# Patient Record
Sex: Female | Born: 1980 | Race: Black or African American | Hispanic: No | Smoking: Never smoker
Health system: Southern US, Community
[De-identification: ages and names within clinical notes are randomized; demographics above are authoritative.]

## PROBLEM LIST (undated history)

## (undated) DIAGNOSIS — I2699 Other pulmonary embolism without acute cor pulmonale: Secondary | ICD-10-CM

## (undated) DIAGNOSIS — D649 Anemia, unspecified: Secondary | ICD-10-CM

## (undated) DIAGNOSIS — I10 Essential (primary) hypertension: Secondary | ICD-10-CM

## (undated) DIAGNOSIS — I82409 Acute embolism and thrombosis of unspecified deep veins of unspecified lower extremity: Secondary | ICD-10-CM

## (undated) DIAGNOSIS — F32A Depression, unspecified: Secondary | ICD-10-CM

## (undated) DIAGNOSIS — F419 Anxiety disorder, unspecified: Secondary | ICD-10-CM

## (undated) HISTORY — PX: HERNIA REPAIR: SHX51

## (undated) HISTORY — PX: TONSILLECTOMY: SUR1361

## (undated) HISTORY — DX: Anxiety disorder, unspecified: F41.9

## (undated) HISTORY — DX: Other pulmonary embolism without acute cor pulmonale: I26.99

## (undated) HISTORY — DX: Anemia, unspecified: D64.9

## (undated) HISTORY — DX: Essential (primary) hypertension: I10

## (undated) HISTORY — DX: Depression, unspecified: F32.A

---

## 2008-12-17 DIAGNOSIS — I82409 Acute embolism and thrombosis of unspecified deep veins of unspecified lower extremity: Secondary | ICD-10-CM

## 2008-12-17 HISTORY — DX: Acute embolism and thrombosis of unspecified deep veins of unspecified lower extremity: I82.409

## 2009-12-18 ENCOUNTER — Ambulatory Visit: Payer: Self-pay | Admitting: Family Medicine

## 2009-12-18 ENCOUNTER — Other Ambulatory Visit: Admission: RE | Admit: 2009-12-18 | Discharge: 2009-12-18 | Payer: Self-pay | Admitting: Family Medicine

## 2009-12-18 DIAGNOSIS — D509 Iron deficiency anemia, unspecified: Secondary | ICD-10-CM | POA: Insufficient documentation

## 2009-12-23 LAB — CONVERTED CEMR LAB
AST: 18 units/L (ref 0–37)
Alkaline Phosphatase: 52 units/L (ref 39–117)
BUN: 11 mg/dL (ref 6–23)
Basophils Absolute: 0 10*3/uL (ref 0.0–0.1)
Bilirubin, Direct: 0.1 mg/dL (ref 0.0–0.3)
Calcium: 9.1 mg/dL (ref 8.4–10.5)
GFR calc non Af Amer: 96.88 mL/min (ref >60)
Glucose, Bld: 80 mg/dL (ref 70–99)
HDL: 56 mg/dL (ref >39.00)
Hemoglobin: 12.4 g/dL (ref 12.0–15.0)
Lymphocytes Relative: 32.3 % (ref 12.0–46.0)
Monocytes Relative: 5.7 % (ref 3.0–12.0)
Neutrophils Relative %: 58.2 % (ref 43.0–77.0)
Platelets: 223 10*3/uL (ref 150.0–400.0)
RDW: 13.7 % (ref 11.5–14.6)
Sodium: 136 meq/L (ref 135–145)
Total Bilirubin: 0.4 mg/dL (ref 0.3–1.2)
Transferrin: 339.4 mg/dL (ref 212.0–360.0)
Triglycerides: 54 mg/dL (ref 0.0–149.0)

## 2010-05-18 NOTE — Letter (Signed)
Summary: Records Dated 04-15-09 thru 06-12-09/[facility]  Records Dated 04-15-09 thru 06-12-09/[facility]   Imported By: Lanelle Bal 12/30/2009 14:26:04  _____________________________________________________________________  External Attachment:    Type:   Image     Comment:   External Document

## 2010-05-18 NOTE — Assessment & Plan Note (Signed)
Summary: NEW TO ESTAB/CBS   Vital Signs:  Patient profile:   30 year old female Menstrual status:  regular LMP:     11/30/2009 Height:      67.75 inches Weight:      189.9 pounds BMI:     29.19 Temp:     98.7 degrees F oral Pulse rate:   80 / minute BP sitting:   120 / 74  (right arm)  Vitals Entered By: Almeta Monas CMA Duncan Dull) (December 18, 2009 10:02 AM) CC: CPX/ fasting, needs Birth control and pap LMP (date): 11/30/2009     Menstrual Status regular Enter LMP: 11/30/2009   History of Present Illness: Pt here for cpe, labs and pap.  No complaints.    Preventive Screening-Counseling & Management  Alcohol-Tobacco     Alcohol drinks/day: 0     Smoking Status: never  Caffeine-Diet-Exercise     Caffeine use/day: 0     Does Patient Exercise: no     Exercise Counseling: to improve exercise regimen  Hep-HIV-STD-Contraception     Dental Visit-last 6 months no     Dental Care Counseling: to seek dental care; no dental care within six months     SBE monthly: no     SBE Education/Counseling: to perform regular SBE      Sexual History:  single and divorced.        Drug Use:  no.    Current Medications (verified): 1)  Cryselle-28 0.3-30 Mg-Mcg Tabs (Norgestrel-Ethinyl Estradiol) .Marland Kitchen.. 1 By Mouth Qd 2)  Ibuprofen 800 Mg Tabs (Ibuprofen) .... By Mouth 3 Times Daily  Allergies (verified): No Known Drug Allergies  Past History:  Family History: Last updated: 12/18/2009 Both parents have HTN Family History Hypertension  Social History: Last updated: 12/18/2009 Occupation:  [employer] Divorced Never Smoked Alcohol use-no Drug use-no Regular exercise-no  Risk Factors: Alcohol Use: 0 (12/18/2009) Caffeine Use: 0 (12/18/2009) Exercise: no (12/18/2009)  Risk Factors: Smoking Status: never (12/18/2009)  Past Medical History: Anemia-iron deficiency superficial clot R leg 08/2009 --[place]  Past Surgical History: C-section 2004 Tonils removed  1997 Tonsillectomy  Family History: Reviewed history and no changes required. Both parents have HTN Family History Hypertension  Social History: Reviewed history and no changes required. Occupation:  [employer] Divorced Never Smoked Alcohol use-no Drug use-no Regular exercise-no Occupation:  employed Smoking Status:  never Drug Use:  no Does Patient Exercise:  no Caffeine use/day:  0 Dental Care w/in 6 mos.:  no Sexual History:  single, divorced  Review of Systems      See HPI General:  Denies chills, fatigue, fever, loss of appetite, malaise, sleep disorder, sweats, weakness, and weight loss. Eyes:  Denies blurring, discharge, double vision, eye irritation, eye pain, halos, itching, light sensitivity, red eye, vision loss-1 eye, and vision loss-both eyes; optho--+ contacts. ENT:  Denies decreased hearing, difficulty swallowing, ear discharge, earache, hoarseness, nasal congestion, nosebleeds, postnasal drainage, ringing in ears, sinus pressure, and sore throat. CV:  Denies bluish discoloration of lips or nails, chest pain or discomfort, difficulty breathing at night, difficulty breathing while lying down, fainting, fatigue, leg cramps with exertion, lightheadness, near fainting, palpitations, shortness of breath with exertion, swelling of feet, swelling of hands, and weight gain. Resp:  Denies chest discomfort, chest pain with inspiration, cough, coughing up blood, excessive snoring, hypersomnolence, morning headaches, pleuritic, shortness of breath, sputum productive, and wheezing. GI:  Denies abdominal pain, bloody stools, change in bowel habits, constipation, dark tarry stools, diarrhea, excessive appetite, gas,  hemorrhoids, indigestion, loss of appetite, and nausea. GU:  Denies abnormal vaginal bleeding, decreased libido, discharge, dysuria, genital sores, hematuria, incontinence, nocturia, urinary frequency, and urinary hesitancy. MS:  Denies joint pain, joint redness, joint  swelling, loss of strength, low back pain, mid back pain, muscle aches, muscle , cramps, muscle weakness, stiffness, and thoracic pain. Derm:  Denies changes in color of skin, changes in nail beds, dryness, excessive perspiration, flushing, hair loss, insect bite(s), itching, lesion(s), poor wound healing, and rash. Neuro:  Denies brief paralysis, difficulty with concentration, disturbances in coordination, falling down, headaches, inability to speak, memory loss, numbness, poor balance, seizures, sensation of room spinning, tingling, tremors, visual disturbances, and weakness. Psych:  Denies alternate hallucination ( auditory/visual), anxiety, depression, easily angered, easily tearful, irritability, mental problems, panic attacks, sense of great danger, suicidal thoughts/plans, thoughts of violence, unusual visions or sounds, and thoughts /plans of harming others. Endo:  Denies cold intolerance, excessive hunger, excessive thirst, excessive urination, heat intolerance, polyuria, and weight change. Heme:  Denies abnormal bruising, bleeding, enlarge lymph nodes, fevers, pallor, and skin discoloration. Allergy:  Denies hives or rash, itching eyes, persistent infections, seasonal allergies, and sneezing.  Physical Exam  General:  Well-developed,well-nourished,in no acute distress; alert,appropriate and cooperative throughout examination Head:  Normocephalic and atraumatic without obvious abnormalities. No apparent alopecia or balding. Eyes:  vision grossly intact, pupils equal, pupils round, pupils reactive to light, and no injection.   Ears:  External ear exam shows no significant lesions or deformities.  Otoscopic examination reveals clear canals, tympanic membranes are intact bilaterally without bulging, retraction, inflammation or discharge. Hearing is grossly normal bilaterally. Nose:  External nasal examination shows no deformity or inflammation. Nasal mucosa are pink and moist without lesions or  exudates. Mouth:  Oral mucosa and oropharynx without lesions or exudates.  Teeth in good repair. Neck:  No deformities, masses, or tenderness noted. Breasts:  No mass, nodules, thickening, tenderness, bulging, retraction, inflamation, nipple discharge or skin changes noted.   Lungs:  Normal respiratory effort, chest expands symmetrically. Lungs are clear to auscultation, no crackles or wheezes. Heart:  Normal rate and regular rhythm. S1 and S2 normal without gallop, murmur, click, rub or other extra sounds. Abdomen:  Bowel sounds positive,abdomen soft and non-tender without masses, organomegaly or hernias noted. Genitalia:  Pelvic Exam:        External: normal female genitalia without lesions or masses        Vagina: normal without lesions or masses        Cervix: normal without lesions or masses        Adnexa: normal bimanual exam without masses or fullness        Uterus: normal by palpation        Pap smear: performed Msk:  normal ROM, no joint tenderness, no joint swelling, no joint warmth, no redness over joints, no joint deformities, no joint instability, and no crepitation.   Pulses:  R posterior tibial normal, R dorsalis pedis normal, R carotid normal, L posterior tibial normal, L dorsalis pedis normal, and L carotid normal.   Extremities:  No clubbing, cyanosis, edema, or deformity noted with normal full range of motion of all joints.   Neurologic:  No cranial nerve deficits noted. Station and gait are normal. Plantar reflexes are down-going bilaterally. DTRs are symmetrical throughout. Sensory, motor and coordinative functions appear intact. Skin:  Intact without suspicious lesions or rashes Cervical Nodes:  No lymphadenopathy noted Axillary Nodes:  No palpable lymphadenopathy Psych:  Cognition and judgment  appear intact. Alert and cooperative with normal attention span and concentration. No apparent delusions, illusions, hallucinations   Impression & Recommendations:  Problem # 1:   PREVENTIVE HEALTH CARE (ICD-V70.0) refill bcp ghm utd gave numbers for ob/gyns to discuss permanent sterilization Orders: Venipuncture (10272) TLB-B12 + Folate Pnl (53664_40347-Q25/ZDG) TLB-IBC Pnl (Iron/FE;Transferrin) (83550-IBC) TLB-Lipid Panel (80061-LIPID) TLB-BMP (Basic Metabolic Panel-BMET) (80048-METABOL) TLB-CBC Platelet - w/Differential (85025-CBCD) TLB-Hepatic/Liver Function Pnl (80076-HEPATIC) TLB-TSH (Thyroid Stimulating Hormone) (84443-TSH) Specimen Handling (38756)  Problem # 2:  ANEMIA-IRON DEFICIENCY (ICD-280.9)  Orders: Venipuncture (43329) TLB-B12 + Folate Pnl ([id number]-T01/SWF) TLB-IBC Pnl (Iron/FE;Transferrin) (83550-IBC) TLB-Lipid Panel (80061-LIPID) TLB-BMP (Basic Metabolic Panel-BMET) (80048-METABOL) TLB-CBC Platelet - w/Differential (85025-CBCD) TLB-Hepatic/Liver Function Pnl (80076-HEPATIC) TLB-TSH (Thyroid Stimulating Hormone) (84443-TSH) Specimen Handling (09323)  Complete Medication List: 1)  Cryselle-28 0.3-30 Mg-mcg Tabs (Norgestrel-ethinyl estradiol) .Marland Kitchen.. 1 by mouth qd 2)  Ibuprofen 800 Mg Tabs (Ibuprofen) .... By mouth 3 times daily  Patient Instructions: 1)  Please schedule a follow-up appointment in 1 year.  Prescriptions: CRYSELLE-28 0.3-30 MG-MCG TABS (NORGESTREL-ETHINYL ESTRADIOL) 1 by mouth qd  #1 x 11   Entered and Authorized by:   Loreen Freud DO   Signed by:   Loreen Freud DO on 12/18/2009   Method used:   Electronically to        [facility] [address] (retail)       [id number]       [place], [place]  [address]       Ph: [phone]       Fax: [phone]   RxID:   [id number]   Flu Vaccine Next Due:  Refused TD Result Date:  12/31/2008 TD Result:  given TD Next Due:  10 yr   Appended Document: NEW TO ESTAB/CBS  Laboratory Results   Urine Tests   Date/Time Reported: December 18, 2009 12:36 PM   Routine Urinalysis   Color: yellow Appearance: Clear Glucose:  negative   (Normal Range: Negative) Bilirubin: negative   (Normal Range: Negative) Ketone: negative   (Normal Range: Negative) Spec. Gravity: 1.015   (Normal Range: 1.003-1.035) Blood: negative   (Normal Range: Negative) pH: 6.0   (Normal Range: 5.0-8.0) Protein: negative   (Normal Range: Negative) Urobilinogen: negative   (Normal Range: 0-1) Nitrite: negative   (Normal Range: Negative) Leukocyte Esterace: negative   (Normal Range: Negative)    Comments: Floydene Flock  December 18, 2009 12:36 PM

## 2011-02-02 ENCOUNTER — Ambulatory Visit (HOSPITAL_BASED_OUTPATIENT_CLINIC_OR_DEPARTMENT_OTHER)
Admission: RE | Admit: 2011-02-02 | Discharge: 2011-02-02 | Disposition: A | Discharge status: Home / Self Care | Payer: 59 | Source: Ambulatory Visit | Attending: Internal Medicine | Admitting: Internal Medicine

## 2011-02-02 ENCOUNTER — Other Ambulatory Visit (HOSPITAL_BASED_OUTPATIENT_CLINIC_OR_DEPARTMENT_OTHER): Payer: Self-pay | Admitting: Internal Medicine

## 2011-02-02 DIAGNOSIS — I82409 Acute embolism and thrombosis of unspecified deep veins of unspecified lower extremity: Secondary | ICD-10-CM

## 2011-02-02 DIAGNOSIS — M79609 Pain in unspecified limb: Secondary | ICD-10-CM | POA: Insufficient documentation

## 2011-04-01 ENCOUNTER — Encounter: Payer: Self-pay | Admitting: Emergency Medicine

## 2011-04-01 ENCOUNTER — Emergency Department (HOSPITAL_BASED_OUTPATIENT_CLINIC_OR_DEPARTMENT_OTHER)
Admission: EM | Admit: 2011-04-01 | Discharge: 2011-04-01 | Disposition: A | Discharge status: Home / Self Care | Payer: 59 | Attending: Emergency Medicine | Admitting: Emergency Medicine

## 2011-04-01 DIAGNOSIS — M79609 Pain in unspecified limb: Secondary | ICD-10-CM | POA: Insufficient documentation

## 2011-04-01 DIAGNOSIS — Z86718 Personal history of other venous thrombosis and embolism: Secondary | ICD-10-CM | POA: Insufficient documentation

## 2011-04-01 DIAGNOSIS — I809 Phlebitis and thrombophlebitis of unspecified site: Secondary | ICD-10-CM | POA: Insufficient documentation

## 2011-04-01 HISTORY — DX: Acute embolism and thrombosis of unspecified deep veins of unspecified lower extremity: I82.409

## 2011-04-01 MED ORDER — HYDROCODONE-ACETAMINOPHEN 5-500 MG PO TABS: 1.0000 | ORAL_TABLET | Freq: Four times a day (QID) | ORAL | Status: AC | PRN

## 2011-04-01 NOTE — ED Notes (Signed)
Pt c/o of right leg pain x 1 day-swelling behind knee-painful/warm  to touch

## 2011-04-01 NOTE — ED Provider Notes (Signed)
I saw and evaluated the patient, reviewed the resident's note and I agree with the findings and plan. Patient with superficial thrombophlebitis of the right leg with mild warmth and erythema. Patient has no swelling in the leg has obvious large varicose veins. No symptoms concerning for DVT. However patient is [redacted] weeks pregnant at this time and we cannot give anti-inflammatories so will have her use warm compresses and compression hose.  Gwyneth Sprout, MD 04/01/11 1220

## 2011-04-01 NOTE — ED Provider Notes (Signed)
History     CSN: [id number] Arrival date & time: 04/01/2011 11:29 AM   First MD Initiated Contact with Patient 04/01/11 1125      Chief Complaint  Patient presents with  . Leg Pain    (Consider location/radiation/quality/duration/timing/severity/associated sxs/prior treatment) HPI Patient is a 30 year old woman who is [redacted] weeks pregnant presenting with pain and erythema of the right lateral knee. She says that in the middle of October, 2 months go, she was diagnosed with superficial thrombophlebitis, and had a doppler study that was negative for DVT. She also had an episode of superficial thrombophlebitis in 2010. Now, she complains of pain on the right knee with standing. She knows that previously she has taken ibuprofen, but cannot take excess ibuprofen because of her pregnancy.  Past Medical History  Diagnosis Date  . DVT (deep venous thrombosis) 12/2008    Past Surgical History  Procedure Date  . Cesarean section 2004  . Tonsillectomy     No family history on file.  History  Substance Use Topics  . Smoking status: Never Smoker   . Smokeless tobacco: Not on file  . Alcohol Use: No    OB History    Grav Para Term Preterm Abortions TAB SAB Ect Mult Living   1               Review of Systems  Allergies  Review of patient's allergies indicates no known allergies.  Home Medications  No current outpatient prescriptions on file.  BP 132/88  Pulse 93  Temp(Src) 97.5 F (36.4 C) (Oral)  Resp 18  Ht 5\' 7"  (1.702 m)  Wt 201 lb (91.173 kg)  BMI 31.48 kg/m2  SpO2 100%  LMP 01/24/2011  Physical Exam GEN: NAD, young woman, well dressed well nourished. RLE: 2 cm circular area of circumscribed erythema and swelling over lateral R knee.  3x4 cm ellipse of erythema with edema on posteriolateral R knee.  Both areas are very TTP and warm.  Posterior knee no TTP.  No distal edema.  Pulses strong distally.  Moving limb w/o difficulty.  Visible varicose veins throughout RLE,  including areas of erythema. LLE: varicose veins, otherwise unremarkable.  ED Course  Procedures (including critical care time)  Labs Reviewed - No data to display No results found.   No diagnosis found.    MDM  30 year old woman with history of superficial thrombophlebitis in 2010 and 2 months ago, now with repeated superficial thrombophlebitis of the right lateral knee. DVT study in October was negative, and no need to repeat at this time given localized findings. Patient is [redacted] weeks pregnant, so cannot give NSAIDs. Will prescribe Tylenol, thigh high leg compression stockings, warm compresses, and leg elevation. She does stand for work, so we will encourage her to sit as much as possible.        Kathreen Cosier, MD 04/01/11 1201

## 2013-01-18 DIAGNOSIS — I10 Essential (primary) hypertension: Secondary | ICD-10-CM | POA: Insufficient documentation

## 2014-02-17 ENCOUNTER — Encounter: Payer: Self-pay | Admitting: Emergency Medicine

## 2016-03-06 LAB — HM HIV SCREENING LAB: HM HIV Screening: NEGATIVE

## 2016-05-09 DIAGNOSIS — I2699 Other pulmonary embolism without acute cor pulmonale: Secondary | ICD-10-CM | POA: Insufficient documentation

## 2016-11-21 DIAGNOSIS — F32A Depression, unspecified: Secondary | ICD-10-CM | POA: Insufficient documentation

## 2018-01-16 DIAGNOSIS — M5412 Radiculopathy, cervical region: Secondary | ICD-10-CM | POA: Insufficient documentation

## 2019-03-06 DIAGNOSIS — F410 Panic disorder [episodic paroxysmal anxiety] without agoraphobia: Secondary | ICD-10-CM | POA: Insufficient documentation

## 2019-03-08 DIAGNOSIS — R87610 Atypical squamous cells of undetermined significance on cytologic smear of cervix (ASC-US): Secondary | ICD-10-CM | POA: Insufficient documentation

## 2019-03-08 DIAGNOSIS — D219 Benign neoplasm of connective and other soft tissue, unspecified: Secondary | ICD-10-CM | POA: Insufficient documentation

## 2019-03-21 DIAGNOSIS — R198 Other specified symptoms and signs involving the digestive system and abdomen: Secondary | ICD-10-CM | POA: Diagnosis not present

## 2019-03-21 DIAGNOSIS — R413 Other amnesia: Secondary | ICD-10-CM | POA: Diagnosis not present

## 2019-03-21 DIAGNOSIS — Z0000 Encounter for general adult medical examination without abnormal findings: Secondary | ICD-10-CM | POA: Diagnosis not present

## 2019-04-03 DIAGNOSIS — K7689 Other specified diseases of liver: Secondary | ICD-10-CM | POA: Diagnosis not present

## 2019-04-03 DIAGNOSIS — F321 Major depressive disorder, single episode, moderate: Secondary | ICD-10-CM | POA: Diagnosis not present

## 2019-04-03 DIAGNOSIS — R198 Other specified symptoms and signs involving the digestive system and abdomen: Secondary | ICD-10-CM | POA: Diagnosis not present

## 2019-04-03 DIAGNOSIS — F410 Panic disorder [episodic paroxysmal anxiety] without agoraphobia: Secondary | ICD-10-CM | POA: Diagnosis not present

## 2019-04-22 DIAGNOSIS — Z1231 Encounter for screening mammogram for malignant neoplasm of breast: Secondary | ICD-10-CM | POA: Diagnosis not present

## 2019-04-22 DIAGNOSIS — Z1239 Encounter for other screening for malignant neoplasm of breast: Secondary | ICD-10-CM | POA: Diagnosis not present

## 2019-04-22 LAB — HM MAMMOGRAPHY

## 2019-04-23 DIAGNOSIS — Z86711 Personal history of pulmonary embolism: Secondary | ICD-10-CM | POA: Diagnosis not present

## 2019-04-23 DIAGNOSIS — R9389 Abnormal findings on diagnostic imaging of other specified body structures: Secondary | ICD-10-CM | POA: Diagnosis not present

## 2019-04-23 DIAGNOSIS — Z8759 Personal history of other complications of pregnancy, childbirth and the puerperium: Secondary | ICD-10-CM | POA: Diagnosis not present

## 2019-04-23 DIAGNOSIS — N852 Hypertrophy of uterus: Secondary | ICD-10-CM | POA: Diagnosis not present

## 2019-04-23 DIAGNOSIS — Z86018 Personal history of other benign neoplasm: Secondary | ICD-10-CM | POA: Diagnosis not present

## 2019-04-23 DIAGNOSIS — Z9889 Other specified postprocedural states: Secondary | ICD-10-CM | POA: Diagnosis not present

## 2019-04-23 DIAGNOSIS — Z8742 Personal history of other diseases of the female genital tract: Secondary | ICD-10-CM | POA: Diagnosis not present

## 2019-04-23 DIAGNOSIS — D251 Intramural leiomyoma of uterus: Secondary | ICD-10-CM | POA: Diagnosis not present

## 2019-04-23 DIAGNOSIS — N925 Other specified irregular menstruation: Secondary | ICD-10-CM | POA: Diagnosis not present

## 2019-05-02 DIAGNOSIS — F410 Panic disorder [episodic paroxysmal anxiety] without agoraphobia: Secondary | ICD-10-CM | POA: Diagnosis not present

## 2019-05-02 DIAGNOSIS — F321 Major depressive disorder, single episode, moderate: Secondary | ICD-10-CM | POA: Diagnosis not present

## 2019-05-30 DIAGNOSIS — M25521 Pain in right elbow: Secondary | ICD-10-CM | POA: Diagnosis not present

## 2019-06-11 DIAGNOSIS — F321 Major depressive disorder, single episode, moderate: Secondary | ICD-10-CM | POA: Diagnosis not present

## 2019-06-11 DIAGNOSIS — F410 Panic disorder [episodic paroxysmal anxiety] without agoraphobia: Secondary | ICD-10-CM | POA: Diagnosis not present

## 2019-06-25 DIAGNOSIS — F321 Major depressive disorder, single episode, moderate: Secondary | ICD-10-CM | POA: Diagnosis not present

## 2019-06-25 DIAGNOSIS — F410 Panic disorder [episodic paroxysmal anxiety] without agoraphobia: Secondary | ICD-10-CM | POA: Diagnosis not present

## 2019-06-27 DIAGNOSIS — E669 Obesity, unspecified: Secondary | ICD-10-CM | POA: Diagnosis not present

## 2019-06-27 DIAGNOSIS — N852 Hypertrophy of uterus: Secondary | ICD-10-CM | POA: Diagnosis not present

## 2019-06-27 DIAGNOSIS — Z9889 Other specified postprocedural states: Secondary | ICD-10-CM | POA: Diagnosis not present

## 2019-06-27 DIAGNOSIS — Z86711 Personal history of pulmonary embolism: Secondary | ICD-10-CM | POA: Diagnosis not present

## 2019-06-27 DIAGNOSIS — R10811 Right upper quadrant abdominal tenderness: Secondary | ICD-10-CM | POA: Diagnosis not present

## 2019-06-27 DIAGNOSIS — K469 Unspecified abdominal hernia without obstruction or gangrene: Secondary | ICD-10-CM | POA: Diagnosis not present

## 2019-06-27 DIAGNOSIS — R9389 Abnormal findings on diagnostic imaging of other specified body structures: Secondary | ICD-10-CM | POA: Diagnosis not present

## 2019-06-27 DIAGNOSIS — D251 Intramural leiomyoma of uterus: Secondary | ICD-10-CM | POA: Diagnosis not present

## 2019-06-27 DIAGNOSIS — Z8759 Personal history of other complications of pregnancy, childbirth and the puerperium: Secondary | ICD-10-CM | POA: Diagnosis not present

## 2019-06-27 DIAGNOSIS — Z01818 Encounter for other preprocedural examination: Secondary | ICD-10-CM | POA: Diagnosis not present

## 2019-06-27 DIAGNOSIS — N925 Other specified irregular menstruation: Secondary | ICD-10-CM | POA: Diagnosis not present

## 2019-07-01 DIAGNOSIS — Z20822 Contact with and (suspected) exposure to covid-19: Secondary | ICD-10-CM | POA: Diagnosis not present

## 2019-07-01 DIAGNOSIS — N926 Irregular menstruation, unspecified: Secondary | ICD-10-CM | POA: Diagnosis not present

## 2019-07-01 DIAGNOSIS — Z01812 Encounter for preprocedural laboratory examination: Secondary | ICD-10-CM | POA: Diagnosis not present

## 2019-07-02 DIAGNOSIS — M25521 Pain in right elbow: Secondary | ICD-10-CM | POA: Diagnosis not present

## 2019-07-05 DIAGNOSIS — N926 Irregular menstruation, unspecified: Secondary | ICD-10-CM | POA: Diagnosis not present

## 2019-07-05 DIAGNOSIS — N840 Polyp of corpus uteri: Secondary | ICD-10-CM | POA: Diagnosis not present

## 2019-07-05 DIAGNOSIS — D250 Submucous leiomyoma of uterus: Secondary | ICD-10-CM | POA: Diagnosis not present

## 2019-07-05 DIAGNOSIS — Z86711 Personal history of pulmonary embolism: Secondary | ICD-10-CM | POA: Diagnosis not present

## 2019-07-05 DIAGNOSIS — Z9889 Other specified postprocedural states: Secondary | ICD-10-CM | POA: Diagnosis not present

## 2019-07-05 DIAGNOSIS — N859 Noninflammatory disorder of uterus, unspecified: Secondary | ICD-10-CM | POA: Diagnosis not present

## 2019-07-05 DIAGNOSIS — Z8759 Personal history of other complications of pregnancy, childbirth and the puerperium: Secondary | ICD-10-CM | POA: Diagnosis not present

## 2019-07-11 DIAGNOSIS — F321 Major depressive disorder, single episode, moderate: Secondary | ICD-10-CM | POA: Diagnosis not present

## 2019-07-11 DIAGNOSIS — F410 Panic disorder [episodic paroxysmal anxiety] without agoraphobia: Secondary | ICD-10-CM | POA: Diagnosis not present

## 2019-07-31 DIAGNOSIS — I10 Essential (primary) hypertension: Secondary | ICD-10-CM | POA: Diagnosis not present

## 2019-07-31 DIAGNOSIS — Z0000 Encounter for general adult medical examination without abnormal findings: Secondary | ICD-10-CM | POA: Diagnosis not present

## 2019-08-08 DIAGNOSIS — E782 Mixed hyperlipidemia: Secondary | ICD-10-CM | POA: Diagnosis not present

## 2019-08-08 DIAGNOSIS — Z09 Encounter for follow-up examination after completed treatment for conditions other than malignant neoplasm: Secondary | ICD-10-CM | POA: Diagnosis not present

## 2019-08-08 DIAGNOSIS — B373 Candidiasis of vulva and vagina: Secondary | ICD-10-CM | POA: Diagnosis not present

## 2019-08-08 DIAGNOSIS — M25561 Pain in right knee: Secondary | ICD-10-CM | POA: Diagnosis not present

## 2019-08-08 DIAGNOSIS — E876 Hypokalemia: Secondary | ICD-10-CM | POA: Diagnosis not present

## 2019-08-08 DIAGNOSIS — I10 Essential (primary) hypertension: Secondary | ICD-10-CM | POA: Diagnosis not present

## 2019-08-08 DIAGNOSIS — N938 Other specified abnormal uterine and vaginal bleeding: Secondary | ICD-10-CM | POA: Diagnosis not present

## 2019-08-08 DIAGNOSIS — R233 Spontaneous ecchymoses: Secondary | ICD-10-CM | POA: Diagnosis not present

## 2019-08-08 DIAGNOSIS — R7301 Impaired fasting glucose: Secondary | ICD-10-CM | POA: Diagnosis not present

## 2019-08-08 DIAGNOSIS — Z8742 Personal history of other diseases of the female genital tract: Secondary | ICD-10-CM | POA: Diagnosis not present

## 2019-08-08 DIAGNOSIS — Z9889 Other specified postprocedural states: Secondary | ICD-10-CM | POA: Diagnosis not present

## 2019-08-09 DIAGNOSIS — I2699 Other pulmonary embolism without acute cor pulmonale: Secondary | ICD-10-CM | POA: Diagnosis not present

## 2019-08-09 DIAGNOSIS — R238 Other skin changes: Secondary | ICD-10-CM | POA: Diagnosis not present

## 2019-08-09 DIAGNOSIS — S8001XA Contusion of right knee, initial encounter: Secondary | ICD-10-CM | POA: Diagnosis not present

## 2019-08-09 DIAGNOSIS — R7301 Impaired fasting glucose: Secondary | ICD-10-CM | POA: Diagnosis not present

## 2019-08-09 DIAGNOSIS — M25561 Pain in right knee: Secondary | ICD-10-CM | POA: Diagnosis not present

## 2019-08-15 DIAGNOSIS — F411 Generalized anxiety disorder: Secondary | ICD-10-CM | POA: Diagnosis not present

## 2019-08-15 DIAGNOSIS — Z79899 Other long term (current) drug therapy: Secondary | ICD-10-CM | POA: Diagnosis not present

## 2019-08-15 DIAGNOSIS — F410 Panic disorder [episodic paroxysmal anxiety] without agoraphobia: Secondary | ICD-10-CM | POA: Diagnosis not present

## 2019-08-15 DIAGNOSIS — F321 Major depressive disorder, single episode, moderate: Secondary | ICD-10-CM | POA: Diagnosis not present

## 2019-08-23 DIAGNOSIS — R7989 Other specified abnormal findings of blood chemistry: Secondary | ICD-10-CM | POA: Diagnosis not present

## 2019-08-23 DIAGNOSIS — Z3202 Encounter for pregnancy test, result negative: Secondary | ICD-10-CM | POA: Diagnosis not present

## 2019-08-23 DIAGNOSIS — R079 Chest pain, unspecified: Secondary | ICD-10-CM | POA: Diagnosis not present

## 2019-08-23 DIAGNOSIS — K439 Ventral hernia without obstruction or gangrene: Secondary | ICD-10-CM | POA: Diagnosis not present

## 2019-08-23 DIAGNOSIS — M791 Myalgia, unspecified site: Secondary | ICD-10-CM | POA: Diagnosis not present

## 2019-08-23 DIAGNOSIS — I82442 Acute embolism and thrombosis of left tibial vein: Secondary | ICD-10-CM | POA: Diagnosis not present

## 2019-08-23 DIAGNOSIS — I82462 Acute embolism and thrombosis of left calf muscular vein: Secondary | ICD-10-CM | POA: Diagnosis not present

## 2019-08-23 DIAGNOSIS — M79662 Pain in left lower leg: Secondary | ICD-10-CM | POA: Diagnosis not present

## 2019-08-23 DIAGNOSIS — R1033 Periumbilical pain: Secondary | ICD-10-CM | POA: Diagnosis not present

## 2019-08-25 DIAGNOSIS — R079 Chest pain, unspecified: Secondary | ICD-10-CM | POA: Diagnosis not present

## 2019-08-28 DIAGNOSIS — D649 Anemia, unspecified: Secondary | ICD-10-CM | POA: Diagnosis not present

## 2019-08-28 DIAGNOSIS — I82442 Acute embolism and thrombosis of left tibial vein: Secondary | ICD-10-CM | POA: Diagnosis not present

## 2019-08-29 DIAGNOSIS — Z8759 Personal history of other complications of pregnancy, childbirth and the puerperium: Secondary | ICD-10-CM | POA: Diagnosis not present

## 2019-08-29 DIAGNOSIS — Z86711 Personal history of pulmonary embolism: Secondary | ICD-10-CM | POA: Diagnosis not present

## 2019-08-29 DIAGNOSIS — Z79899 Other long term (current) drug therapy: Secondary | ICD-10-CM | POA: Diagnosis not present

## 2019-08-29 DIAGNOSIS — Z86718 Personal history of other venous thrombosis and embolism: Secondary | ICD-10-CM | POA: Diagnosis not present

## 2019-08-29 DIAGNOSIS — I824Z2 Acute embolism and thrombosis of unspecified deep veins of left distal lower extremity: Secondary | ICD-10-CM | POA: Diagnosis not present

## 2019-08-29 DIAGNOSIS — D689 Coagulation defect, unspecified: Secondary | ICD-10-CM | POA: Diagnosis not present

## 2019-08-29 DIAGNOSIS — Z7901 Long term (current) use of anticoagulants: Secondary | ICD-10-CM | POA: Diagnosis not present

## 2019-08-29 DIAGNOSIS — T148XXA Other injury of unspecified body region, initial encounter: Secondary | ICD-10-CM | POA: Diagnosis not present

## 2019-08-29 DIAGNOSIS — D509 Iron deficiency anemia, unspecified: Secondary | ICD-10-CM | POA: Diagnosis not present

## 2019-08-29 DIAGNOSIS — D649 Anemia, unspecified: Secondary | ICD-10-CM | POA: Diagnosis not present

## 2019-09-05 DIAGNOSIS — M1711 Unilateral primary osteoarthritis, right knee: Secondary | ICD-10-CM | POA: Diagnosis not present

## 2019-09-10 DIAGNOSIS — F410 Panic disorder [episodic paroxysmal anxiety] without agoraphobia: Secondary | ICD-10-CM | POA: Diagnosis not present

## 2019-09-10 DIAGNOSIS — F321 Major depressive disorder, single episode, moderate: Secondary | ICD-10-CM | POA: Diagnosis not present

## 2019-09-10 DIAGNOSIS — Z86718 Personal history of other venous thrombosis and embolism: Secondary | ICD-10-CM | POA: Diagnosis not present

## 2019-09-17 DIAGNOSIS — F410 Panic disorder [episodic paroxysmal anxiety] without agoraphobia: Secondary | ICD-10-CM | POA: Diagnosis not present

## 2019-09-17 DIAGNOSIS — F321 Major depressive disorder, single episode, moderate: Secondary | ICD-10-CM | POA: Diagnosis not present

## 2019-09-23 DIAGNOSIS — F321 Major depressive disorder, single episode, moderate: Secondary | ICD-10-CM | POA: Diagnosis not present

## 2019-09-23 DIAGNOSIS — F410 Panic disorder [episodic paroxysmal anxiety] without agoraphobia: Secondary | ICD-10-CM | POA: Diagnosis not present

## 2019-09-24 DIAGNOSIS — Z86718 Personal history of other venous thrombosis and embolism: Secondary | ICD-10-CM | POA: Diagnosis not present

## 2019-09-24 DIAGNOSIS — M79651 Pain in right thigh: Secondary | ICD-10-CM | POA: Diagnosis not present

## 2019-09-25 DIAGNOSIS — M79651 Pain in right thigh: Secondary | ICD-10-CM | POA: Diagnosis not present

## 2019-09-25 DIAGNOSIS — Z86718 Personal history of other venous thrombosis and embolism: Secondary | ICD-10-CM | POA: Diagnosis not present

## 2019-10-01 DIAGNOSIS — F410 Panic disorder [episodic paroxysmal anxiety] without agoraphobia: Secondary | ICD-10-CM | POA: Diagnosis not present

## 2019-10-01 DIAGNOSIS — F321 Major depressive disorder, single episode, moderate: Secondary | ICD-10-CM | POA: Diagnosis not present

## 2019-10-07 DIAGNOSIS — F321 Major depressive disorder, single episode, moderate: Secondary | ICD-10-CM | POA: Diagnosis not present

## 2019-10-07 DIAGNOSIS — F410 Panic disorder [episodic paroxysmal anxiety] without agoraphobia: Secondary | ICD-10-CM | POA: Diagnosis not present

## 2019-10-15 DIAGNOSIS — F410 Panic disorder [episodic paroxysmal anxiety] without agoraphobia: Secondary | ICD-10-CM | POA: Diagnosis not present

## 2019-10-15 DIAGNOSIS — F321 Major depressive disorder, single episode, moderate: Secondary | ICD-10-CM | POA: Diagnosis not present

## 2019-10-28 DIAGNOSIS — F321 Major depressive disorder, single episode, moderate: Secondary | ICD-10-CM | POA: Diagnosis not present

## 2019-10-28 DIAGNOSIS — F410 Panic disorder [episodic paroxysmal anxiety] without agoraphobia: Secondary | ICD-10-CM | POA: Diagnosis not present

## 2019-10-30 DIAGNOSIS — R238 Other skin changes: Secondary | ICD-10-CM | POA: Diagnosis not present

## 2019-10-30 DIAGNOSIS — D72819 Decreased white blood cell count, unspecified: Secondary | ICD-10-CM | POA: Diagnosis not present

## 2019-10-30 DIAGNOSIS — R233 Spontaneous ecchymoses: Secondary | ICD-10-CM | POA: Diagnosis not present

## 2019-10-30 DIAGNOSIS — R42 Dizziness and giddiness: Secondary | ICD-10-CM | POA: Diagnosis not present

## 2019-10-30 DIAGNOSIS — I82402 Acute embolism and thrombosis of unspecified deep veins of left lower extremity: Secondary | ICD-10-CM | POA: Diagnosis not present

## 2019-10-30 DIAGNOSIS — D509 Iron deficiency anemia, unspecified: Secondary | ICD-10-CM | POA: Diagnosis not present

## 2019-10-30 DIAGNOSIS — Z7901 Long term (current) use of anticoagulants: Secondary | ICD-10-CM | POA: Diagnosis not present

## 2019-10-30 DIAGNOSIS — Z86718 Personal history of other venous thrombosis and embolism: Secondary | ICD-10-CM | POA: Diagnosis not present

## 2019-10-30 DIAGNOSIS — Z79899 Other long term (current) drug therapy: Secondary | ICD-10-CM | POA: Diagnosis not present

## 2019-10-30 DIAGNOSIS — Z86711 Personal history of pulmonary embolism: Secondary | ICD-10-CM | POA: Diagnosis not present

## 2019-11-11 DIAGNOSIS — F321 Major depressive disorder, single episode, moderate: Secondary | ICD-10-CM | POA: Diagnosis not present

## 2019-11-11 DIAGNOSIS — F410 Panic disorder [episodic paroxysmal anxiety] without agoraphobia: Secondary | ICD-10-CM | POA: Diagnosis not present

## 2019-11-12 DIAGNOSIS — Z86018 Personal history of other benign neoplasm: Secondary | ICD-10-CM | POA: Diagnosis not present

## 2019-11-12 DIAGNOSIS — R7303 Prediabetes: Secondary | ICD-10-CM | POA: Diagnosis not present

## 2019-11-12 DIAGNOSIS — H43393 Other vitreous opacities, bilateral: Secondary | ICD-10-CM | POA: Diagnosis not present

## 2019-11-14 DIAGNOSIS — Z09 Encounter for follow-up examination after completed treatment for conditions other than malignant neoplasm: Secondary | ICD-10-CM | POA: Diagnosis not present

## 2019-11-14 DIAGNOSIS — Z8639 Personal history of other endocrine, nutritional and metabolic disease: Secondary | ICD-10-CM | POA: Diagnosis not present

## 2019-11-18 DIAGNOSIS — Z79899 Other long term (current) drug therapy: Secondary | ICD-10-CM | POA: Diagnosis not present

## 2019-11-18 DIAGNOSIS — F410 Panic disorder [episodic paroxysmal anxiety] without agoraphobia: Secondary | ICD-10-CM | POA: Diagnosis not present

## 2019-11-18 DIAGNOSIS — F321 Major depressive disorder, single episode, moderate: Secondary | ICD-10-CM | POA: Diagnosis not present

## 2019-11-18 DIAGNOSIS — F411 Generalized anxiety disorder: Secondary | ICD-10-CM | POA: Diagnosis not present

## 2019-11-25 DIAGNOSIS — F321 Major depressive disorder, single episode, moderate: Secondary | ICD-10-CM | POA: Diagnosis not present

## 2019-11-25 DIAGNOSIS — F410 Panic disorder [episodic paroxysmal anxiety] without agoraphobia: Secondary | ICD-10-CM | POA: Diagnosis not present

## 2019-11-27 DIAGNOSIS — Z86718 Personal history of other venous thrombosis and embolism: Secondary | ICD-10-CM | POA: Diagnosis not present

## 2019-11-27 DIAGNOSIS — M7989 Other specified soft tissue disorders: Secondary | ICD-10-CM | POA: Diagnosis not present

## 2019-11-27 DIAGNOSIS — M79651 Pain in right thigh: Secondary | ICD-10-CM | POA: Diagnosis not present

## 2019-12-09 DIAGNOSIS — F410 Panic disorder [episodic paroxysmal anxiety] without agoraphobia: Secondary | ICD-10-CM | POA: Diagnosis not present

## 2019-12-09 DIAGNOSIS — F321 Major depressive disorder, single episode, moderate: Secondary | ICD-10-CM | POA: Diagnosis not present

## 2019-12-16 DIAGNOSIS — E119 Type 2 diabetes mellitus without complications: Secondary | ICD-10-CM | POA: Diagnosis not present

## 2019-12-16 DIAGNOSIS — H43393 Other vitreous opacities, bilateral: Secondary | ICD-10-CM | POA: Diagnosis not present

## 2019-12-24 DIAGNOSIS — F321 Major depressive disorder, single episode, moderate: Secondary | ICD-10-CM | POA: Diagnosis not present

## 2019-12-24 DIAGNOSIS — F410 Panic disorder [episodic paroxysmal anxiety] without agoraphobia: Secondary | ICD-10-CM | POA: Diagnosis not present

## 2019-12-27 ENCOUNTER — Ambulatory Visit: Payer: Medicaid Other | Attending: Internal Medicine

## 2019-12-27 DIAGNOSIS — Z23 Encounter for immunization: Secondary | ICD-10-CM

## 2019-12-27 NOTE — Progress Notes (Signed)
° °  Covid-19 Vaccination Clinic  Name:  Jody Sandoval    MRN: [id number] DOB: [birth year]  12/27/2019  [name] was observed post Covid-19 immunization for 15 minutes without incident. She was provided with Vaccine Information Sheet and instruction to access the V-Safe system.   [name] was instructed to call 911 with any severe reactions post vaccine:  Difficulty breathing   Swelling of face and throat   A fast heartbeat   A bad rash all over body   Dizziness and weakness   Immunizations Administered    Name Date Dose VIS Date Route   Pfizer COVID-19 Vaccine 12/27/2019 12:14 PM 0.3 mL 06/12/2018 Intramuscular   Manufacturer: [employer], [employer]   Lot: [id number]   NDC: [address]-4

## 2020-01-09 DIAGNOSIS — F410 Panic disorder [episodic paroxysmal anxiety] without agoraphobia: Secondary | ICD-10-CM | POA: Diagnosis not present

## 2020-01-09 DIAGNOSIS — F321 Major depressive disorder, single episode, moderate: Secondary | ICD-10-CM | POA: Diagnosis not present

## 2020-01-20 DIAGNOSIS — F321 Major depressive disorder, single episode, moderate: Secondary | ICD-10-CM | POA: Diagnosis not present

## 2020-01-20 DIAGNOSIS — F410 Panic disorder [episodic paroxysmal anxiety] without agoraphobia: Secondary | ICD-10-CM | POA: Diagnosis not present

## 2020-02-05 DIAGNOSIS — F410 Panic disorder [episodic paroxysmal anxiety] without agoraphobia: Secondary | ICD-10-CM | POA: Diagnosis not present

## 2020-02-05 DIAGNOSIS — F321 Major depressive disorder, single episode, moderate: Secondary | ICD-10-CM | POA: Diagnosis not present

## 2020-02-17 DIAGNOSIS — F410 Panic disorder [episodic paroxysmal anxiety] without agoraphobia: Secondary | ICD-10-CM | POA: Diagnosis not present

## 2020-02-17 DIAGNOSIS — F321 Major depressive disorder, single episode, moderate: Secondary | ICD-10-CM | POA: Diagnosis not present

## 2020-02-17 DIAGNOSIS — F411 Generalized anxiety disorder: Secondary | ICD-10-CM | POA: Diagnosis not present

## 2020-02-19 DIAGNOSIS — F410 Panic disorder [episodic paroxysmal anxiety] without agoraphobia: Secondary | ICD-10-CM | POA: Diagnosis not present

## 2020-02-19 DIAGNOSIS — F320 Major depressive disorder, single episode, mild: Secondary | ICD-10-CM | POA: Diagnosis not present

## 2020-03-03 DIAGNOSIS — Z86711 Personal history of pulmonary embolism: Secondary | ICD-10-CM | POA: Diagnosis not present

## 2020-03-03 DIAGNOSIS — E782 Mixed hyperlipidemia: Secondary | ICD-10-CM | POA: Diagnosis not present

## 2020-03-03 DIAGNOSIS — I10 Essential (primary) hypertension: Secondary | ICD-10-CM | POA: Diagnosis not present

## 2020-03-03 DIAGNOSIS — Z7901 Long term (current) use of anticoagulants: Secondary | ICD-10-CM | POA: Diagnosis not present

## 2020-03-03 DIAGNOSIS — R002 Palpitations: Secondary | ICD-10-CM | POA: Diagnosis not present

## 2020-03-03 DIAGNOSIS — Z8759 Personal history of other complications of pregnancy, childbirth and the puerperium: Secondary | ICD-10-CM | POA: Diagnosis not present

## 2020-03-03 DIAGNOSIS — R7303 Prediabetes: Secondary | ICD-10-CM | POA: Diagnosis not present

## 2020-03-04 DIAGNOSIS — F321 Major depressive disorder, single episode, moderate: Secondary | ICD-10-CM | POA: Diagnosis not present

## 2020-03-04 DIAGNOSIS — F410 Panic disorder [episodic paroxysmal anxiety] without agoraphobia: Secondary | ICD-10-CM | POA: Diagnosis not present

## 2020-03-18 DIAGNOSIS — F320 Major depressive disorder, single episode, mild: Secondary | ICD-10-CM | POA: Diagnosis not present

## 2020-03-18 DIAGNOSIS — F410 Panic disorder [episodic paroxysmal anxiety] without agoraphobia: Secondary | ICD-10-CM | POA: Diagnosis not present

## 2020-03-25 DIAGNOSIS — Z1151 Encounter for screening for human papillomavirus (HPV): Secondary | ICD-10-CM | POA: Diagnosis not present

## 2020-03-25 DIAGNOSIS — Z124 Encounter for screening for malignant neoplasm of cervix: Secondary | ICD-10-CM | POA: Diagnosis not present

## 2020-03-25 DIAGNOSIS — Z8742 Personal history of other diseases of the female genital tract: Secondary | ICD-10-CM | POA: Diagnosis not present

## 2020-03-25 DIAGNOSIS — Z0001 Encounter for general adult medical examination with abnormal findings: Secondary | ICD-10-CM | POA: Diagnosis not present

## 2020-03-25 DIAGNOSIS — N920 Excessive and frequent menstruation with regular cycle: Secondary | ICD-10-CM | POA: Diagnosis not present

## 2020-03-25 LAB — HM PAP SMEAR: HM Pap smear: NEGATIVE

## 2020-04-01 DIAGNOSIS — F410 Panic disorder [episodic paroxysmal anxiety] without agoraphobia: Secondary | ICD-10-CM | POA: Diagnosis not present

## 2020-04-01 DIAGNOSIS — F320 Major depressive disorder, single episode, mild: Secondary | ICD-10-CM | POA: Diagnosis not present

## 2020-04-06 ENCOUNTER — Ambulatory Visit: Payer: 59 | Admitting: Internal Medicine

## 2020-04-16 DIAGNOSIS — R300 Dysuria: Secondary | ICD-10-CM | POA: Diagnosis not present

## 2020-04-16 DIAGNOSIS — R399 Unspecified symptoms and signs involving the genitourinary system: Secondary | ICD-10-CM | POA: Diagnosis not present

## 2020-04-16 DIAGNOSIS — R8290 Unspecified abnormal findings in urine: Secondary | ICD-10-CM | POA: Diagnosis not present

## 2020-04-21 DIAGNOSIS — F320 Major depressive disorder, single episode, mild: Secondary | ICD-10-CM | POA: Diagnosis not present

## 2020-04-21 DIAGNOSIS — F410 Panic disorder [episodic paroxysmal anxiety] without agoraphobia: Secondary | ICD-10-CM | POA: Diagnosis not present

## 2020-04-24 DIAGNOSIS — Z03818 Encounter for observation for suspected exposure to other biological agents ruled out: Secondary | ICD-10-CM | POA: Diagnosis not present

## 2020-04-29 DIAGNOSIS — F321 Major depressive disorder, single episode, moderate: Secondary | ICD-10-CM | POA: Diagnosis not present

## 2020-04-29 DIAGNOSIS — F410 Panic disorder [episodic paroxysmal anxiety] without agoraphobia: Secondary | ICD-10-CM | POA: Diagnosis not present

## 2020-05-13 DIAGNOSIS — F410 Panic disorder [episodic paroxysmal anxiety] without agoraphobia: Secondary | ICD-10-CM | POA: Diagnosis not present

## 2020-05-13 DIAGNOSIS — F321 Major depressive disorder, single episode, moderate: Secondary | ICD-10-CM | POA: Diagnosis not present

## 2020-05-15 DIAGNOSIS — D259 Leiomyoma of uterus, unspecified: Secondary | ICD-10-CM | POA: Diagnosis not present

## 2020-05-18 DIAGNOSIS — F321 Major depressive disorder, single episode, moderate: Secondary | ICD-10-CM | POA: Diagnosis not present

## 2020-05-18 DIAGNOSIS — F411 Generalized anxiety disorder: Secondary | ICD-10-CM | POA: Diagnosis not present

## 2020-05-21 DIAGNOSIS — B9620 Unspecified Escherichia coli [E. coli] as the cause of diseases classified elsewhere: Secondary | ICD-10-CM | POA: Diagnosis not present

## 2020-05-21 DIAGNOSIS — D500 Iron deficiency anemia secondary to blood loss (chronic): Secondary | ICD-10-CM | POA: Diagnosis not present

## 2020-05-21 DIAGNOSIS — D509 Iron deficiency anemia, unspecified: Secondary | ICD-10-CM | POA: Diagnosis not present

## 2020-05-21 DIAGNOSIS — N390 Urinary tract infection, site not specified: Secondary | ICD-10-CM | POA: Diagnosis not present

## 2020-05-21 DIAGNOSIS — Z7901 Long term (current) use of anticoagulants: Secondary | ICD-10-CM | POA: Diagnosis not present

## 2020-05-21 DIAGNOSIS — R350 Frequency of micturition: Secondary | ICD-10-CM | POA: Diagnosis not present

## 2020-05-21 DIAGNOSIS — Z86718 Personal history of other venous thrombosis and embolism: Secondary | ICD-10-CM | POA: Diagnosis not present

## 2020-05-21 DIAGNOSIS — N920 Excessive and frequent menstruation with regular cycle: Secondary | ICD-10-CM | POA: Diagnosis not present

## 2020-05-27 DIAGNOSIS — F321 Major depressive disorder, single episode, moderate: Secondary | ICD-10-CM | POA: Diagnosis not present

## 2020-05-27 DIAGNOSIS — F410 Panic disorder [episodic paroxysmal anxiety] without agoraphobia: Secondary | ICD-10-CM | POA: Diagnosis not present

## 2020-06-01 DIAGNOSIS — D219 Benign neoplasm of connective and other soft tissue, unspecified: Secondary | ICD-10-CM | POA: Diagnosis not present

## 2020-06-01 DIAGNOSIS — N920 Excessive and frequent menstruation with regular cycle: Secondary | ICD-10-CM | POA: Diagnosis not present

## 2020-06-01 DIAGNOSIS — Z9889 Other specified postprocedural states: Secondary | ICD-10-CM | POA: Diagnosis not present

## 2020-06-01 DIAGNOSIS — Z7901 Long term (current) use of anticoagulants: Secondary | ICD-10-CM | POA: Diagnosis not present

## 2020-06-01 DIAGNOSIS — N852 Hypertrophy of uterus: Secondary | ICD-10-CM | POA: Diagnosis not present

## 2020-06-01 DIAGNOSIS — D500 Iron deficiency anemia secondary to blood loss (chronic): Secondary | ICD-10-CM | POA: Diagnosis not present

## 2020-06-01 DIAGNOSIS — Z86718 Personal history of other venous thrombosis and embolism: Secondary | ICD-10-CM | POA: Diagnosis not present

## 2020-06-15 DIAGNOSIS — F321 Major depressive disorder, single episode, moderate: Secondary | ICD-10-CM | POA: Diagnosis not present

## 2020-06-15 DIAGNOSIS — D219 Benign neoplasm of connective and other soft tissue, unspecified: Secondary | ICD-10-CM | POA: Diagnosis not present

## 2020-06-15 DIAGNOSIS — F410 Panic disorder [episodic paroxysmal anxiety] without agoraphobia: Secondary | ICD-10-CM | POA: Diagnosis not present

## 2020-06-15 DIAGNOSIS — F411 Generalized anxiety disorder: Secondary | ICD-10-CM | POA: Diagnosis not present

## 2020-06-16 DIAGNOSIS — Z86718 Personal history of other venous thrombosis and embolism: Secondary | ICD-10-CM | POA: Diagnosis not present

## 2020-06-16 DIAGNOSIS — Z86711 Personal history of pulmonary embolism: Secondary | ICD-10-CM | POA: Diagnosis not present

## 2020-06-16 DIAGNOSIS — K432 Incisional hernia without obstruction or gangrene: Secondary | ICD-10-CM | POA: Diagnosis not present

## 2020-06-16 DIAGNOSIS — I10 Essential (primary) hypertension: Secondary | ICD-10-CM | POA: Diagnosis not present

## 2020-06-16 DIAGNOSIS — Z7901 Long term (current) use of anticoagulants: Secondary | ICD-10-CM | POA: Diagnosis not present

## 2020-06-17 DIAGNOSIS — B9620 Unspecified Escherichia coli [E. coli] as the cause of diseases classified elsewhere: Secondary | ICD-10-CM | POA: Diagnosis not present

## 2020-06-17 DIAGNOSIS — R350 Frequency of micturition: Secondary | ICD-10-CM | POA: Diagnosis not present

## 2020-06-17 DIAGNOSIS — F321 Major depressive disorder, single episode, moderate: Secondary | ICD-10-CM | POA: Diagnosis not present

## 2020-06-17 DIAGNOSIS — F410 Panic disorder [episodic paroxysmal anxiety] without agoraphobia: Secondary | ICD-10-CM | POA: Diagnosis not present

## 2020-06-17 DIAGNOSIS — N390 Urinary tract infection, site not specified: Secondary | ICD-10-CM | POA: Diagnosis not present

## 2020-06-17 DIAGNOSIS — B373 Candidiasis of vulva and vagina: Secondary | ICD-10-CM | POA: Diagnosis not present

## 2020-06-23 ENCOUNTER — Ambulatory Visit: Payer: 59 | Admitting: Internal Medicine

## 2020-06-24 DIAGNOSIS — K432 Incisional hernia without obstruction or gangrene: Secondary | ICD-10-CM | POA: Diagnosis not present

## 2020-06-26 DIAGNOSIS — D2361 Other benign neoplasm of skin of right upper limb, including shoulder: Secondary | ICD-10-CM | POA: Diagnosis not present

## 2020-07-01 DIAGNOSIS — F321 Major depressive disorder, single episode, moderate: Secondary | ICD-10-CM | POA: Diagnosis not present

## 2020-07-01 DIAGNOSIS — F410 Panic disorder [episodic paroxysmal anxiety] without agoraphobia: Secondary | ICD-10-CM | POA: Diagnosis not present

## 2020-07-07 DIAGNOSIS — Z87440 Personal history of urinary (tract) infections: Secondary | ICD-10-CM | POA: Diagnosis not present

## 2020-07-07 DIAGNOSIS — E782 Mixed hyperlipidemia: Secondary | ICD-10-CM | POA: Diagnosis not present

## 2020-07-07 DIAGNOSIS — R3914 Feeling of incomplete bladder emptying: Secondary | ICD-10-CM | POA: Diagnosis not present

## 2020-07-07 DIAGNOSIS — R10819 Abdominal tenderness, unspecified site: Secondary | ICD-10-CM | POA: Diagnosis not present

## 2020-07-07 DIAGNOSIS — D500 Iron deficiency anemia secondary to blood loss (chronic): Secondary | ICD-10-CM | POA: Diagnosis not present

## 2020-07-07 DIAGNOSIS — D259 Leiomyoma of uterus, unspecified: Secondary | ICD-10-CM | POA: Diagnosis not present

## 2020-07-07 DIAGNOSIS — N390 Urinary tract infection, site not specified: Secondary | ICD-10-CM | POA: Diagnosis not present

## 2020-07-07 DIAGNOSIS — N852 Hypertrophy of uterus: Secondary | ICD-10-CM | POA: Diagnosis not present

## 2020-07-07 DIAGNOSIS — N920 Excessive and frequent menstruation with regular cycle: Secondary | ICD-10-CM | POA: Diagnosis not present

## 2020-07-08 DIAGNOSIS — K432 Incisional hernia without obstruction or gangrene: Secondary | ICD-10-CM | POA: Diagnosis not present

## 2020-07-09 DIAGNOSIS — I10 Essential (primary) hypertension: Secondary | ICD-10-CM | POA: Diagnosis not present

## 2020-07-09 DIAGNOSIS — N390 Urinary tract infection, site not specified: Secondary | ICD-10-CM | POA: Diagnosis not present

## 2020-07-09 DIAGNOSIS — R3914 Feeling of incomplete bladder emptying: Secondary | ICD-10-CM | POA: Diagnosis not present

## 2020-07-09 DIAGNOSIS — E782 Mixed hyperlipidemia: Secondary | ICD-10-CM | POA: Diagnosis not present

## 2020-07-09 DIAGNOSIS — R002 Palpitations: Secondary | ICD-10-CM | POA: Diagnosis not present

## 2020-07-09 DIAGNOSIS — Z86711 Personal history of pulmonary embolism: Secondary | ICD-10-CM | POA: Diagnosis not present

## 2020-07-09 DIAGNOSIS — E876 Hypokalemia: Secondary | ICD-10-CM | POA: Diagnosis not present

## 2020-07-09 DIAGNOSIS — Z86718 Personal history of other venous thrombosis and embolism: Secondary | ICD-10-CM | POA: Diagnosis not present

## 2020-07-09 DIAGNOSIS — D259 Leiomyoma of uterus, unspecified: Secondary | ICD-10-CM | POA: Diagnosis not present

## 2020-07-13 DIAGNOSIS — D259 Leiomyoma of uterus, unspecified: Secondary | ICD-10-CM | POA: Diagnosis not present

## 2020-07-16 DIAGNOSIS — K432 Incisional hernia without obstruction or gangrene: Secondary | ICD-10-CM | POA: Diagnosis not present

## 2020-07-16 DIAGNOSIS — D219 Benign neoplasm of connective and other soft tissue, unspecified: Secondary | ICD-10-CM | POA: Diagnosis not present

## 2020-07-16 DIAGNOSIS — Z9889 Other specified postprocedural states: Secondary | ICD-10-CM | POA: Diagnosis not present

## 2020-07-16 DIAGNOSIS — Z86718 Personal history of other venous thrombosis and embolism: Secondary | ICD-10-CM | POA: Diagnosis not present

## 2020-07-16 DIAGNOSIS — D500 Iron deficiency anemia secondary to blood loss (chronic): Secondary | ICD-10-CM | POA: Diagnosis not present

## 2020-07-16 DIAGNOSIS — N920 Excessive and frequent menstruation with regular cycle: Secondary | ICD-10-CM | POA: Diagnosis not present

## 2020-07-17 DIAGNOSIS — F321 Major depressive disorder, single episode, moderate: Secondary | ICD-10-CM | POA: Diagnosis not present

## 2020-07-17 DIAGNOSIS — F410 Panic disorder [episodic paroxysmal anxiety] without agoraphobia: Secondary | ICD-10-CM | POA: Diagnosis not present

## 2020-07-30 DIAGNOSIS — D219 Benign neoplasm of connective and other soft tissue, unspecified: Secondary | ICD-10-CM | POA: Diagnosis not present

## 2020-07-30 DIAGNOSIS — N920 Excessive and frequent menstruation with regular cycle: Secondary | ICD-10-CM | POA: Diagnosis not present

## 2020-07-30 DIAGNOSIS — D500 Iron deficiency anemia secondary to blood loss (chronic): Secondary | ICD-10-CM | POA: Diagnosis not present

## 2020-08-12 DIAGNOSIS — F410 Panic disorder [episodic paroxysmal anxiety] without agoraphobia: Secondary | ICD-10-CM | POA: Diagnosis not present

## 2020-08-12 DIAGNOSIS — F321 Major depressive disorder, single episode, moderate: Secondary | ICD-10-CM | POA: Diagnosis not present

## 2020-08-13 DIAGNOSIS — D219 Benign neoplasm of connective and other soft tissue, unspecified: Secondary | ICD-10-CM | POA: Diagnosis not present

## 2020-08-25 DIAGNOSIS — Z01818 Encounter for other preprocedural examination: Secondary | ICD-10-CM | POA: Diagnosis not present

## 2020-08-25 DIAGNOSIS — Z7901 Long term (current) use of anticoagulants: Secondary | ICD-10-CM | POA: Diagnosis not present

## 2020-08-25 DIAGNOSIS — R002 Palpitations: Secondary | ICD-10-CM | POA: Diagnosis not present

## 2020-08-25 DIAGNOSIS — F410 Panic disorder [episodic paroxysmal anxiety] without agoraphobia: Secondary | ICD-10-CM | POA: Diagnosis not present

## 2020-08-25 DIAGNOSIS — I10 Essential (primary) hypertension: Secondary | ICD-10-CM | POA: Diagnosis not present

## 2020-08-25 DIAGNOSIS — K432 Incisional hernia without obstruction or gangrene: Secondary | ICD-10-CM | POA: Diagnosis not present

## 2020-08-25 DIAGNOSIS — F321 Major depressive disorder, single episode, moderate: Secondary | ICD-10-CM | POA: Diagnosis not present

## 2020-08-27 DIAGNOSIS — N920 Excessive and frequent menstruation with regular cycle: Secondary | ICD-10-CM | POA: Diagnosis not present

## 2020-08-27 DIAGNOSIS — D219 Benign neoplasm of connective and other soft tissue, unspecified: Secondary | ICD-10-CM | POA: Diagnosis not present

## 2020-08-27 DIAGNOSIS — Z86718 Personal history of other venous thrombosis and embolism: Secondary | ICD-10-CM | POA: Diagnosis not present

## 2020-08-27 DIAGNOSIS — Z7901 Long term (current) use of anticoagulants: Secondary | ICD-10-CM | POA: Diagnosis not present

## 2020-08-27 DIAGNOSIS — D215 Benign neoplasm of connective and other soft tissue of pelvis: Secondary | ICD-10-CM | POA: Diagnosis not present

## 2020-08-27 DIAGNOSIS — D500 Iron deficiency anemia secondary to blood loss (chronic): Secondary | ICD-10-CM | POA: Diagnosis not present

## 2020-09-02 DIAGNOSIS — D251 Intramural leiomyoma of uterus: Secondary | ICD-10-CM | POA: Diagnosis not present

## 2020-09-03 DIAGNOSIS — K432 Incisional hernia without obstruction or gangrene: Secondary | ICD-10-CM | POA: Diagnosis not present

## 2020-09-03 DIAGNOSIS — D251 Intramural leiomyoma of uterus: Secondary | ICD-10-CM | POA: Diagnosis not present

## 2020-09-03 DIAGNOSIS — N838 Other noninflammatory disorders of ovary, fallopian tube and broad ligament: Secondary | ICD-10-CM | POA: Diagnosis not present

## 2020-09-03 DIAGNOSIS — N888 Other specified noninflammatory disorders of cervix uteri: Secondary | ICD-10-CM | POA: Diagnosis not present

## 2020-09-03 DIAGNOSIS — N800 Endometriosis of uterus: Secondary | ICD-10-CM | POA: Diagnosis not present

## 2020-09-03 HISTORY — PX: ABDOMINAL HYSTERECTOMY: SHX81

## 2020-09-09 DIAGNOSIS — F410 Panic disorder [episodic paroxysmal anxiety] without agoraphobia: Secondary | ICD-10-CM | POA: Diagnosis not present

## 2020-09-09 DIAGNOSIS — F321 Major depressive disorder, single episode, moderate: Secondary | ICD-10-CM | POA: Diagnosis not present

## 2020-09-10 DIAGNOSIS — Z48815 Encounter for surgical aftercare following surgery on the digestive system: Secondary | ICD-10-CM | POA: Diagnosis not present

## 2020-09-10 DIAGNOSIS — L539 Erythematous condition, unspecified: Secondary | ICD-10-CM | POA: Diagnosis not present

## 2020-09-10 DIAGNOSIS — Z90710 Acquired absence of both cervix and uterus: Secondary | ICD-10-CM | POA: Diagnosis not present

## 2020-09-11 DIAGNOSIS — Z79899 Other long term (current) drug therapy: Secondary | ICD-10-CM | POA: Diagnosis not present

## 2020-09-11 DIAGNOSIS — F411 Generalized anxiety disorder: Secondary | ICD-10-CM | POA: Diagnosis not present

## 2020-09-11 DIAGNOSIS — F410 Panic disorder [episodic paroxysmal anxiety] without agoraphobia: Secondary | ICD-10-CM | POA: Diagnosis not present

## 2020-09-11 DIAGNOSIS — F321 Major depressive disorder, single episode, moderate: Secondary | ICD-10-CM | POA: Diagnosis not present

## 2020-09-23 DIAGNOSIS — F321 Major depressive disorder, single episode, moderate: Secondary | ICD-10-CM | POA: Diagnosis not present

## 2020-09-23 DIAGNOSIS — F410 Panic disorder [episodic paroxysmal anxiety] without agoraphobia: Secondary | ICD-10-CM | POA: Diagnosis not present

## 2020-10-01 DIAGNOSIS — R319 Hematuria, unspecified: Secondary | ICD-10-CM | POA: Diagnosis not present

## 2020-10-01 DIAGNOSIS — D7289 Other specified disorders of white blood cells: Secondary | ICD-10-CM | POA: Diagnosis not present

## 2020-10-01 DIAGNOSIS — N390 Urinary tract infection, site not specified: Secondary | ICD-10-CM | POA: Diagnosis not present

## 2020-10-01 DIAGNOSIS — R808 Other proteinuria: Secondary | ICD-10-CM | POA: Diagnosis not present

## 2020-10-01 DIAGNOSIS — R82998 Other abnormal findings in urine: Secondary | ICD-10-CM | POA: Diagnosis not present

## 2020-10-01 DIAGNOSIS — Z87440 Personal history of urinary (tract) infections: Secondary | ICD-10-CM | POA: Diagnosis not present

## 2020-10-13 DIAGNOSIS — F410 Panic disorder [episodic paroxysmal anxiety] without agoraphobia: Secondary | ICD-10-CM | POA: Diagnosis not present

## 2020-10-13 DIAGNOSIS — F321 Major depressive disorder, single episode, moderate: Secondary | ICD-10-CM | POA: Diagnosis not present

## 2020-10-27 DIAGNOSIS — F410 Panic disorder [episodic paroxysmal anxiety] without agoraphobia: Secondary | ICD-10-CM | POA: Diagnosis not present

## 2020-10-27 DIAGNOSIS — F321 Major depressive disorder, single episode, moderate: Secondary | ICD-10-CM | POA: Diagnosis not present

## 2020-11-13 DIAGNOSIS — F410 Panic disorder [episodic paroxysmal anxiety] without agoraphobia: Secondary | ICD-10-CM | POA: Diagnosis not present

## 2020-11-13 DIAGNOSIS — F321 Major depressive disorder, single episode, moderate: Secondary | ICD-10-CM | POA: Diagnosis not present

## 2020-11-27 DIAGNOSIS — F321 Major depressive disorder, single episode, moderate: Secondary | ICD-10-CM | POA: Diagnosis not present

## 2020-11-27 DIAGNOSIS — F410 Panic disorder [episodic paroxysmal anxiety] without agoraphobia: Secondary | ICD-10-CM | POA: Diagnosis not present

## 2020-12-08 DIAGNOSIS — F411 Generalized anxiety disorder: Secondary | ICD-10-CM | POA: Diagnosis not present

## 2020-12-08 DIAGNOSIS — F410 Panic disorder [episodic paroxysmal anxiety] without agoraphobia: Secondary | ICD-10-CM | POA: Diagnosis not present

## 2020-12-08 DIAGNOSIS — F321 Major depressive disorder, single episode, moderate: Secondary | ICD-10-CM | POA: Diagnosis not present

## 2020-12-11 DIAGNOSIS — F410 Panic disorder [episodic paroxysmal anxiety] without agoraphobia: Secondary | ICD-10-CM | POA: Diagnosis not present

## 2020-12-11 DIAGNOSIS — F321 Major depressive disorder, single episode, moderate: Secondary | ICD-10-CM | POA: Diagnosis not present

## 2020-12-17 DIAGNOSIS — D500 Iron deficiency anemia secondary to blood loss (chronic): Secondary | ICD-10-CM | POA: Diagnosis not present

## 2020-12-23 DIAGNOSIS — H43393 Other vitreous opacities, bilateral: Secondary | ICD-10-CM | POA: Diagnosis not present

## 2020-12-23 DIAGNOSIS — E119 Type 2 diabetes mellitus without complications: Secondary | ICD-10-CM | POA: Diagnosis not present

## 2020-12-23 DIAGNOSIS — F410 Panic disorder [episodic paroxysmal anxiety] without agoraphobia: Secondary | ICD-10-CM | POA: Diagnosis not present

## 2020-12-23 DIAGNOSIS — F321 Major depressive disorder, single episode, moderate: Secondary | ICD-10-CM | POA: Diagnosis not present

## 2020-12-23 DIAGNOSIS — H5213 Myopia, bilateral: Secondary | ICD-10-CM | POA: Diagnosis not present

## 2020-12-23 DIAGNOSIS — H52203 Unspecified astigmatism, bilateral: Secondary | ICD-10-CM | POA: Diagnosis not present

## 2021-01-06 DIAGNOSIS — H5213 Myopia, bilateral: Secondary | ICD-10-CM | POA: Diagnosis not present

## 2021-01-06 DIAGNOSIS — F410 Panic disorder [episodic paroxysmal anxiety] without agoraphobia: Secondary | ICD-10-CM | POA: Diagnosis not present

## 2021-01-06 DIAGNOSIS — F321 Major depressive disorder, single episode, moderate: Secondary | ICD-10-CM | POA: Diagnosis not present

## 2021-01-20 DIAGNOSIS — F410 Panic disorder [episodic paroxysmal anxiety] without agoraphobia: Secondary | ICD-10-CM | POA: Diagnosis not present

## 2021-01-20 DIAGNOSIS — F321 Major depressive disorder, single episode, moderate: Secondary | ICD-10-CM | POA: Diagnosis not present

## 2021-01-21 DIAGNOSIS — I10 Essential (primary) hypertension: Secondary | ICD-10-CM | POA: Diagnosis not present

## 2021-01-21 DIAGNOSIS — K649 Unspecified hemorrhoids: Secondary | ICD-10-CM | POA: Diagnosis not present

## 2021-01-21 DIAGNOSIS — E782 Mixed hyperlipidemia: Secondary | ICD-10-CM | POA: Diagnosis not present

## 2021-01-21 DIAGNOSIS — R002 Palpitations: Secondary | ICD-10-CM | POA: Diagnosis not present

## 2021-01-21 DIAGNOSIS — K625 Hemorrhage of anus and rectum: Secondary | ICD-10-CM | POA: Diagnosis not present

## 2021-01-21 DIAGNOSIS — Z86718 Personal history of other venous thrombosis and embolism: Secondary | ICD-10-CM | POA: Diagnosis not present

## 2021-01-21 DIAGNOSIS — Z23 Encounter for immunization: Secondary | ICD-10-CM | POA: Diagnosis not present

## 2021-01-21 DIAGNOSIS — Z1211 Encounter for screening for malignant neoplasm of colon: Secondary | ICD-10-CM | POA: Diagnosis not present

## 2021-02-02 DIAGNOSIS — R109 Unspecified abdominal pain: Secondary | ICD-10-CM | POA: Diagnosis not present

## 2021-02-02 DIAGNOSIS — G8918 Other acute postprocedural pain: Secondary | ICD-10-CM | POA: Diagnosis not present

## 2021-02-11 DIAGNOSIS — Z1211 Encounter for screening for malignant neoplasm of colon: Secondary | ICD-10-CM | POA: Diagnosis not present

## 2021-02-11 DIAGNOSIS — K625 Hemorrhage of anus and rectum: Secondary | ICD-10-CM | POA: Diagnosis not present

## 2021-02-11 DIAGNOSIS — Z800 Family history of malignant neoplasm of digestive organs: Secondary | ICD-10-CM | POA: Diagnosis not present

## 2021-03-03 DIAGNOSIS — F410 Panic disorder [episodic paroxysmal anxiety] without agoraphobia: Secondary | ICD-10-CM | POA: Diagnosis not present

## 2021-03-03 DIAGNOSIS — F321 Major depressive disorder, single episode, moderate: Secondary | ICD-10-CM | POA: Diagnosis not present

## 2021-03-03 DIAGNOSIS — F411 Generalized anxiety disorder: Secondary | ICD-10-CM | POA: Diagnosis not present

## 2021-03-03 DIAGNOSIS — Z79899 Other long term (current) drug therapy: Secondary | ICD-10-CM | POA: Diagnosis not present

## 2021-04-23 ENCOUNTER — Ambulatory Visit: Payer: Medicaid Other | Admitting: Family

## 2021-04-23 ENCOUNTER — Encounter: Payer: Self-pay | Admitting: Family

## 2021-04-23 VITALS — BP 150/90 | HR 75 | Temp 98.0°F | Ht 67.0 in | Wt 260.0 lb

## 2021-04-23 DIAGNOSIS — I10 Essential (primary) hypertension: Secondary | ICD-10-CM | POA: Diagnosis not present

## 2021-04-23 DIAGNOSIS — E785 Hyperlipidemia, unspecified: Secondary | ICD-10-CM

## 2021-04-23 DIAGNOSIS — Z86711 Personal history of pulmonary embolism: Secondary | ICD-10-CM | POA: Diagnosis not present

## 2021-04-23 DIAGNOSIS — Z1231 Encounter for screening mammogram for malignant neoplasm of breast: Secondary | ICD-10-CM

## 2021-04-23 DIAGNOSIS — F32A Depression, unspecified: Secondary | ICD-10-CM | POA: Diagnosis not present

## 2021-04-23 DIAGNOSIS — F419 Anxiety disorder, unspecified: Secondary | ICD-10-CM

## 2021-04-23 NOTE — Patient Instructions (Signed)
Please start checking your blood pressure regularly; let me know if it remains elevated;  Please schedule your mammogram; please schedule follow up with GYN as we discussed;

## 2021-04-23 NOTE — Progress Notes (Signed)
Jody Sandoval is a 41 y.o. female with the following history as recorded in EpicCare:  Patient Active Problem List   Diagnosis Date Noted   ANEMIA-IRON DEFICIENCY 12/18/2009    Current Outpatient Medications  Medication Sig Dispense Refill   ELIQUIS 5 MG TABS tablet Take 5 mg by mouth 2 (two) times daily.     escitalopram (LEXAPRO) 20 MG tablet Take 20 mg by mouth daily.     hydrochlorothiazide (HYDRODIURIL) 12.5 MG tablet Take 1 tablet by mouth daily.     potassium chloride (MICRO-K) 10 MEQ CR capsule TAKE 1 CAPSULE(10 MEQ) BY MOUTH DAILY     propranolol (INDERAL) 10 MG tablet Take 10 mg by mouth 2 (two) times daily as needed.     rosuvastatin (CRESTOR) 5 MG tablet Take 5 mg by mouth daily.     valsartan (DIOVAN) 160 MG tablet Take 160 mg by mouth daily.     No current facility-administered medications for this visit.    Allergies: Nifedipine, Lisinopril, Tape, and Ferric carboxymaltose  Past Medical History:  Diagnosis Date   DVT (deep venous thrombosis) (HCC) 12/2008    Past Surgical History:  Procedure Laterality Date   ABDOMINAL HYSTERECTOMY  09/03/2020   CESAREAN SECTION  04/18/2002   TONSILLECTOMY      No family history on file.  Social History   Tobacco Use   Smoking status: Never   Smokeless tobacco: Not on file  Substance Use Topics   Alcohol use: No    Subjective:  Presents today as a new patient; transferring from [facility]; majority of specialists are in [facility] system- undecided about making those changes at this time.   History of hypertension- did not take her medication yesterday; is committed to making time for herself/ taking better care of her own needs;  Lifetime anticoagulation- history of multiple PE/ DVT;   Working with therapist for anxiety- stable on Lexapro;  Overdue for mammogram/ needs to establish with new GYN;   Scheduled for baseline colonoscopy;    Objective:  Vitals:   04/23/21 0905 04/23/21 1157  BP: (!)  150/100 (!) 150/90  Pulse: 75   Temp: 98 F (36.7 C)   TempSrc: Oral   SpO2: 98%   Weight: 260 lb (117.9 kg)   Height: 5\' 7"  (1.702 m)     General: Well developed, well nourished, in no acute distress  Skin : Warm and dry.  Head: Normocephalic and atraumatic  Eyes: Sclera and conjunctiva clear; pupils round and reactive to light; extraocular movements intact  Ears: External normal; canals clear; tympanic membranes normal  Oropharynx: Pink, supple. No suspicious lesions  Neck: Supple without thyromegaly, adenopathy  Lungs: Respirations unlabored; clear to auscultation bilaterally without wheeze, rales, rhonchi  CVS exam: normal rate and regular rhythm.  Musculoskeletal: No deformities; no active joint inflammation  Extremities: No edema, cyanosis, clubbing  Vessels: Symmetric bilaterally  Neurologic: Alert and oriented; speech intact; face symmetrical; moves all extremities well; CNII-XII intact without focal deficit   Assessment:  1. Primary hypertension   2. Visit for screening mammogram   3. Hyperlipidemia, unspecified hyperlipidemia type   4. Anxiety and depression   5. History of pulmonary embolus (PE)     Plan:  ? Control; patient will monitor and try to take her medication daily; if blood pressure remains elevated, she will call back and will increase HCTZ to 25 mg daily;  Order updated; Continue same medication; Continue same medication and working with therapist; Lifetime anticoagulation on  Eliquis;  This visit occurred during the SARS-CoV-2 public health emergency.  Safety protocols were in place, including screening questions prior to the visit, additional usage of staff PPE, and extensive cleaning of exam room while observing appropriate contact time as indicated for disinfecting solutions.    Return in about 3 months (around 07/22/2021).  Orders Placed This Encounter  Procedures   MM Digital Screening    Standing Status:   Future    Standing Expiration Date:    04/23/2022    Order Specific Question:   Reason for Exam (SYMPTOM  OR DIAGNOSIS REQUIRED)    Answer:   screening mammogram    Order Specific Question:   Is the patient pregnant?    Answer:   No    Order Specific Question:   Preferred imaging location?    Answer:   [facility]   [facility]    This external order was created through the Results Console.    Requested Prescriptions    No prescriptions requested or ordered in this encounter

## 2021-05-09 ENCOUNTER — Encounter: Payer: Self-pay | Admitting: Family

## 2021-05-09 ENCOUNTER — Other Ambulatory Visit: Payer: Self-pay | Admitting: Family

## 2021-05-10 ENCOUNTER — Other Ambulatory Visit: Payer: Self-pay | Admitting: Family

## 2021-05-10 DIAGNOSIS — R519 Headache, unspecified: Secondary | ICD-10-CM

## 2021-05-10 MED ORDER — ROSUVASTATIN CALCIUM 5 MG PO TABS: 5.0000 mg | ORAL_TABLET | Freq: Every day | ORAL | 1 refills | Status: DC

## 2021-05-10 MED ORDER — POTASSIUM CHLORIDE ER 10 MEQ PO CPCR: ORAL_CAPSULE | ORAL | 1 refills | Status: DC

## 2021-05-18 ENCOUNTER — Other Ambulatory Visit (HOSPITAL_BASED_OUTPATIENT_CLINIC_OR_DEPARTMENT_OTHER): Payer: Self-pay

## 2021-05-21 ENCOUNTER — Encounter (HOSPITAL_BASED_OUTPATIENT_CLINIC_OR_DEPARTMENT_OTHER): Payer: Self-pay

## 2021-05-21 ENCOUNTER — Other Ambulatory Visit: Payer: Self-pay

## 2021-05-21 ENCOUNTER — Ambulatory Visit (HOSPITAL_BASED_OUTPATIENT_CLINIC_OR_DEPARTMENT_OTHER)
Admission: RE | Admit: 2021-05-21 | Discharge: 2021-05-21 | Disposition: A | Discharge status: Home / Self Care | Payer: Medicaid Other | Source: Ambulatory Visit | Attending: Family | Admitting: Family

## 2021-05-21 DIAGNOSIS — Z1231 Encounter for screening mammogram for malignant neoplasm of breast: Secondary | ICD-10-CM | POA: Insufficient documentation

## 2021-06-03 DIAGNOSIS — F411 Generalized anxiety disorder: Secondary | ICD-10-CM | POA: Diagnosis not present

## 2021-06-03 DIAGNOSIS — F321 Major depressive disorder, single episode, moderate: Secondary | ICD-10-CM | POA: Diagnosis not present

## 2021-06-03 DIAGNOSIS — F410 Panic disorder [episodic paroxysmal anxiety] without agoraphobia: Secondary | ICD-10-CM | POA: Diagnosis not present

## 2021-06-03 DIAGNOSIS — Z79899 Other long term (current) drug therapy: Secondary | ICD-10-CM | POA: Diagnosis not present

## 2021-06-04 ENCOUNTER — Encounter: Payer: Self-pay | Admitting: Psychiatry

## 2021-06-04 ENCOUNTER — Ambulatory Visit: Payer: Medicaid Other | Admitting: Psychiatry

## 2021-06-04 VITALS — BP 128/85 | HR 79 | Ht 67.0 in | Wt 266.6 lb

## 2021-06-04 DIAGNOSIS — M542 Cervicalgia: Secondary | ICD-10-CM

## 2021-06-04 DIAGNOSIS — G44309 Post-traumatic headache, unspecified, not intractable: Secondary | ICD-10-CM | POA: Diagnosis not present

## 2021-06-04 DIAGNOSIS — R42 Dizziness and giddiness: Secondary | ICD-10-CM

## 2021-06-04 NOTE — Progress Notes (Signed)
Referring:  Olive Bass, FNP [address] [place],  [place] [address]  PCP: Olive Bass, FNP  Neurology was asked to evaluate Jody Sandoval, a 41 year old female for a chief complaint of headaches.  Our recommendations of care will be communicated by shared medical record.    CC:  headaches  HPI:  Medical co-morbidities: depression, HTN, HLD, DVT/PE on eliquis  The patient presents for evaluation of headaches and vertigo. Headaches began about 6 months ago. She states she hit her head on a shelf a couple of months ago. Had the headaches before this, but thinks vertigo may have started after hitting her head. Headaches initially were intermittent, but have increased in frequency and now they are every other day. They are described as holocephalic throbbing with phonophobia. They are severe enough that she will have to lie down and they will last the rest of the day. She does not take any over the counter medication for them as she would prefer to avoid medication if possible.  Vertigo is described as a spinning sensation which occurs when she bends forward. She will also have this when turning in bed at night. This comes on intermittently with variable frequency. Family told her that her eyes move back and forward when she is dizzy.  She also endorses a lot of tension in her neck.  Headache History: Onset: 6 months Triggers: no Aura: no Location: holocephalic, frontal Quality/Description: throbbing Severity: 6/10 Associated Symptoms:  Photophobia: no  Phonophobia: yes  Nausea: no Worse with activity?: yes Duration of headaches: last all day  Headache days per month: 15 Headache free days per month: 15  Current Treatment: Abortive none  Preventative none  Prior Therapies                                 Escitalopram Propranolol 10 mg BID Valsartan 160 mg daily Lisinopril - cough   Headache Risk Factors: Headache risk factors  and/or co-morbidities (+) Neck Pain (+) History of Traumatic Brain Injury and/or Concussion  LABS: CBC    Component Value Date/Time   WBC 4.8 12/18/2009 1033   RBC 3.93 12/18/2009 1033   HGB 12.4 12/18/2009 1033   HCT 36.8 12/18/2009 1033   PLT 223.0 12/18/2009 1033   MCV 93.5 12/18/2009 1033   MCHC 33.8 12/18/2009 1033   RDW 13.7 12/18/2009 1033   LYMPHSABS 1.6 12/18/2009 1033   MONOABS 0.3 12/18/2009 1033   EOSABS 0.2 12/18/2009 1033   BASOSABS 0.0 12/18/2009 1033   CMP Latest Ref Rng & Units 12/18/2009  Glucose 70 - 99 mg/dL 80  BUN 6 - 23 mg/dL 11  Creatinine 0.4 - 1.2 mg/dL 0.7  Sodium 332 - 951 meq/L 136  Potassium 3.5 - 5.1 meq/L 4.0  Chloride 96 - 112 meq/L 103  CO2 19 - 32 meq/L 25  Calcium 8.4 - 10.5 mg/dL 9.1  Total Protein 6.0 - 8.3 g/dL 6.7  Total Bilirubin 0.3 - 1.2 mg/dL 0.4  Alkaline Phos 39 - 117 units/L 52  AST 0 - 37 units/L 18  ALT 0 - 35 units/L 19     IMAGING:  [facility] 2020: unremarkable  MRI brain 10/20/2017: unremarkable  Current Outpatient Medications on File Prior to Visit  Medication Sig Dispense Refill   ELIQUIS 5 MG TABS tablet Take 5 mg by mouth 2 (two) times daily.     escitalopram (LEXAPRO) 20  MG tablet Take 20 mg by mouth daily.     hydrochlorothiazide (HYDRODIURIL) 12.5 MG tablet Take 1 tablet by mouth daily.     potassium chloride (MICRO-K) 10 MEQ CR capsule TAKE 1 CAPSULE(10 MEQ) BY MOUTH DAILY 90 capsule 1   propranolol (INDERAL) 10 MG tablet Take 10 mg by mouth 2 (two) times daily as needed.     rosuvastatin (CRESTOR) 5 MG tablet Take 1 tablet (5 mg total) by mouth daily. 90 tablet 1   valsartan (DIOVAN) 160 MG tablet Take 160 mg by mouth daily.     No current facility-administered medications on file prior to visit.     Allergies: Allergies  Allergen Reactions   Nifedipine Swelling   Lisinopril Other (See Comments)    Cough Cough    Tape Itching    Blue tape   Ferric Carboxymaltose Itching    Family  History: Migraine or other headaches in the family:  sister Aneurysms in a first degree relative:  no Brain tumors in the family:  no Other neurological illness in the family:   no  Past Medical History: Past Medical History:  Diagnosis Date   DVT (deep venous thrombosis) (HCC) 12/17/2008   Pulmonary embolism [facility])     Past Surgical History Past Surgical History:  Procedure Laterality Date   ABDOMINAL HYSTERECTOMY  09/03/2020   CESAREAN SECTION  04/18/2002   TONSILLECTOMY      Social History: Social History   Tobacco Use   Smoking status: Never  Substance Use Topics   Alcohol use: No   Drug use: No    ROS: Negative for fevers, chills. Positive for headaches. All other systems reviewed and negative unless stated otherwise in HPI.   Physical Exam:   Vital Signs: BP 128/85    Pulse 79    Ht 5\' 7"  (1.702 m)    Wt 266 lb 9.6 oz (120.9 kg)    LMP 01/24/2011    BMI 41.76 kg/m  GENERAL: well appearing,in no acute distress,alert SKIN:  Color, texture, turgor normal. No rashes or lesions HEAD:  Normocephalic/atraumatic. CV:  RRR RESP: Normal respiratory effort MSK: +tenderness to palpation over bilateral temples, occiput, neck, and shoulders  NEUROLOGICAL: Mental Status: Alert, oriented to person, place and time,Follows commands Cranial Nerves: PERRL,visual fields intact to confrontation,extraocular movements intact,facial sensation intact,no facial droop or ptosis,hearing grossly intact, no dysarthria Motor: muscle strength 5/5 both upper and lower extremities,no drift, normal tone Reflexes: 2+ throughout Sensation: intact to light touch all 4 extremities Coordination: Finger-to- nose-finger intact bilaterally Gait: normal-based  No vertigo or nystagmus elicited on Dix-Hallpike today  IMPRESSION: 41 year old female with a history of depression, HTN, HLD, DVT/PE on Eliquis who presents for evaluation of worsening headaches and vertigo. Will order CTH to look for  post-traumatic changes given history of head trauma on Eliquis. Will refer to vestibular therapy to help manage her vertigo. Suspect her headaches are migraines given severity and throbbing component. She would prefer to avoid medication options at this time and go through PT first.  PLAN: -[facility] -Referral to vestibular therapy for vertigo -next steps: consider neck PT, increasing propranolol for prevention  I spent a total of 31 minutes chart reviewing and counseling the patient. Headache education was done. Discussed treatment options including preventive medications and physical therapy. Written educational materials and patient instructions outlining all of the above were given.  Follow-up: 4 months   53, MD 06/04/2021   11:47 AM

## 2021-06-04 NOTE — Patient Instructions (Signed)
Referral to vestibular therapy CT of the brain

## 2021-06-08 ENCOUNTER — Telehealth: Payer: Self-pay | Admitting: Psychiatry

## 2021-06-08 NOTE — Telephone Encounter (Signed)
[facility]: [phone] (exp. 06/08/21 to 08/06/21) order sent to GI, they will reach out to the patient to schedule.

## 2021-06-10 ENCOUNTER — Ambulatory Visit
Admission: RE | Admit: 2021-06-10 | Discharge: 2021-06-10 | Disposition: A | Discharge status: Home / Self Care | Payer: Medicaid Other | Source: Ambulatory Visit | Attending: Psychiatry | Admitting: Psychiatry

## 2021-06-10 DIAGNOSIS — G44309 Post-traumatic headache, unspecified, not intractable: Secondary | ICD-10-CM

## 2021-06-10 DIAGNOSIS — R519 Headache, unspecified: Secondary | ICD-10-CM | POA: Diagnosis not present

## 2021-06-16 DIAGNOSIS — F410 Panic disorder [episodic paroxysmal anxiety] without agoraphobia: Secondary | ICD-10-CM | POA: Diagnosis not present

## 2021-06-16 DIAGNOSIS — F411 Generalized anxiety disorder: Secondary | ICD-10-CM | POA: Diagnosis not present

## 2021-06-16 DIAGNOSIS — F321 Major depressive disorder, single episode, moderate: Secondary | ICD-10-CM | POA: Diagnosis not present

## 2021-06-18 ENCOUNTER — Ambulatory Visit: Payer: Medicaid Other | Attending: Psychiatry | Admitting: Physical Therapy

## 2021-06-18 ENCOUNTER — Other Ambulatory Visit: Payer: Self-pay

## 2021-06-18 DIAGNOSIS — R42 Dizziness and giddiness: Secondary | ICD-10-CM | POA: Insufficient documentation

## 2021-06-18 DIAGNOSIS — G4452 New daily persistent headache (NDPH): Secondary | ICD-10-CM | POA: Insufficient documentation

## 2021-06-18 DIAGNOSIS — M542 Cervicalgia: Secondary | ICD-10-CM | POA: Diagnosis not present

## 2021-06-18 NOTE — Therapy (Addendum)
?OUTPATIENT PHYSICAL THERAPY VESTIBULAR EVALUATION ? ? ?Patient Name: Jody Sandoval ?MRN: [id number] ?DOB:06/25/1980, 41 y.o., female ?Today's Date: 06/18/2021 ? ?PCP: Olive Bass, FNP ?REFERRING PROVIDER: Ocie Doyne, MD ? ? PT End of Session - 06/18/21 1519   ? ? Visit Number 1   ? Number of Visits 7   ? Date for PT Re-Evaluation 08/06/21   ? Authorization Type Medicaid   ? Authorization - Visit Number 0   ? Authorization - Number of Visits 6   ? PT Start Time 1022   ? PT Stop Time 1102   ? PT Time Calculation (min) 40 min   ? Activity Tolerance Patient tolerated treatment well   ? Behavior During Therapy [facility] for tasks assessed/performed   ? ?  ?  ? ?  ? ? ?Past Medical History:  ?Diagnosis Date  ? DVT (deep venous thrombosis) (HCC) 12/17/2008  ? Pulmonary embolism (HCC)   ? ?Past Surgical History:  ?Procedure Laterality Date  ? ABDOMINAL HYSTERECTOMY  09/03/2020  ? CESAREAN SECTION  04/18/2002  ? TONSILLECTOMY    ? ?Patient Active Problem List  ? Diagnosis Date Noted  ? 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 ? ?ONSET DATE: 06/04/2021 ? ?REFERRING DIAG: R42 (ICD-10-CM) - Vertigo ?M54.2 (ICD-10-CM) - Cervicalgia ? ?THERAPY DIAG:  ?Cervicalgia ? ?Dizziness and giddiness ? ?New daily persistent headache ? ?SUBJECTIVE:  ? ?SUBJECTIVE STATEMENT: ?Dizziness began a few months ago.  Having a hard time with memory, attention and HA.  Symptoms began after hitting her head on a shelf.  Was getting items out of washing machine and when she came up she hit her head on an open cabinet/shelf, -LOC.  Denies changes in hearing, vision, nausea or vomiting.  Dizziness began later; notices it mostly when bending forwards and coming back up.  Is having a hard time sleeping; can go to sleep but not able to stay asleep.      ? ?Pt accompanied by: self ? ?PERTINENT HISTORY: depression, DVT ? ? ?PAIN:  ?Are you having pain? Yes ?NPRS scale: 4-5/10 ?Pain location: head and neck strain ?Pain orientation: Posterior  ?PAIN TYPE:  aching, dull, throbbing, and fatigue ?Pain description: constant  ?Aggravating factors: bending down to the ground; when she first wakes up; sensitivity to sound ?Relieving factors: as the day goes on; tylenol does not help  ? ?PRECAUTIONS: None ? ?WEIGHT BEARING RESTRICTIONS No ? ?FALLS: Has patient fallen in last 6 months? No, Number of falls: 0 ? ?LIVING ENVIRONMENT: ?Lives with: lives with their family and lives with their spouse; 3 kids ?Lives in: Other apartment ?Stairs: Yes; External: 4 steps; rail but doesn't use ?Has following equipment at home: None ? ?PLOF: Independent; driving and still working full time - computer work and data entry.  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   ? ?PATIENT GOALS : Get rid of the HA; very fatiguing  ? ?OBJECTIVE:  ? ?DIAGNOSTIC FINDINGS: Normal CT scan ? ?COGNITION: ?Overall cognitive status: Impaired/different from baseline ?Areas of impairment: Attention and Memory due to HA and fatigue ?  ?SENSATION: ?Light touch: Appears intact ? ? ?POSTURE: rounded shoulders, forward head, and increased lumbar lordosis ? ? ?Cervical AROM/PROM: ? ?A/PROM A/PROM (deg) ?06/18/2021  ?Flexion 40  ?Extension 30  ?Right lateral flexion 30 pain R side  ?Left lateral flexion 40  ?Right rotation 60 pain R side  ?Left rotation 60  ?(Blank rows = not tested) ? ?STRENGTH: WFL ? ? ?TRANSFERS:  All independent ? ? ?GAIT:  Independent ? ? ?PATIENT SURVEYS:  ?FOTO DPS: 53 and DFS: 53.1 ? ? ?VESTIBULAR ASSESSMENT ? ? GENERAL OBSERVATION: dizziness is intermittent; last episode was a couple weeks ago.  Happens when she bends down low and turns head to look under bed or in fridge (down and turn). ?  ? SYMPTOM BEHAVIOR: ?  Subjective history: see subjective above ?  Non-Vestibular symptoms: neck pain and headaches ?  Type of dizziness: Spinning/Vertigo and Unsteady with head/body turns ?  Frequency: intermittent  ?  Duration: brief 10-15 seconds ?  Aggravating factors: Induced by motion:  bending down to the ground ?  Relieving factors: head stationary ?  Progression of symptoms: better ? ? OCULOMOTOR EXAM: ?  Ocular Alignment: normal ?  Ocular ROM: No Limitations ?  Spontaneous Nystagmus: absent ?  Gaze-Induced Nystagmus: absent ?  Smooth Pursuits: intact ?  Saccades: intact ?  Convergence/Divergence: Not tested ? ? ? VESTIBULAR - OCULAR REFLEX:  ?  Slow VOR: Normal ?  VOR Cancellation: Normal ?  Head-Impulse Test: HIT Right: negative ?HIT Left: negative ?  Dynamic Visual Acuity:  N/A ?  ? POSITIONAL TESTING: Right Dix-Hallpike: none; Duration:0 ?Left Dix-Hallpike: none; Duration: 0 ?Right Roll Test: none; Duration: 0 ?Left Roll Test: none; Duration: 0 ?  ? ?MOTION SENSITIVITY: ? ?  Motion Sensitivity Quotient ? ?Intensity: 0 = none, 1 = Lightheaded, 2 = Mild, 3 = Moderate, 4 = Severe, 5 = Vomiting ? Intensity  ?1. Sitting to supine 0  ?2. Supine to L side 0  ?3. Supine to R side 0  ?4. Supine to sitting 0  ?5. L Hallpike-Dix 0  ?6. Up from L  0  ?7. R Hallpike-Dix 0  ?8. Up from R  0  ?9. Sitting, head  ?tipped to L knee 0 but increased head pressure  ?10. Head up from L  ?knee 0 but increased head pressure  ?11. Sitting, head  ?tipped to R knee 0 but increased head pressure  ?12. Head up from R  ?knee 0 but increased head pressure  ?13. Sitting head turns x5 0  ?14.Sitting head nods x5 0  ?15. In stance, 180?  ?turn to L  0  ?16. In stance, 180?  ?turn to R 0  ?  ?OTHOSTATICS: not done ? ? ? ?PATIENT EDUCATION: ?Education details: Clinical findings, no indication of BPPV today, PT POC and goals ?Person educated: Patient ?Education method: Explanation ?Education comprehension: verbalized understanding ? ?ASSESSMENT: ? ?CLINICAL IMPRESSION: ?Patient is a 41 y.o. Female who was seen today for physical therapy evaluation and treatment for cervicalgia, headaches, and dizziness after striking her head on a shelf/cabinet door. ? ?OBJECTIVE IMPAIRMENTS decreased activity tolerance, decreased ROM,  dizziness, and pain.  ? ?ACTIVITY LIMITATIONS cleaning, meal prep, occupation, laundry, and shopping.  ? ?PERSONAL FACTORS Past/current experiences, Profession, Time since onset of injury/illness/exacerbation, and 1-2 comorbidities: HA x 6 months, DVT, PE, depression, HTN, HLD  are also affecting patient's functional outcome.  ? ? ?REHAB POTENTIAL: Good ? ?CLINICAL DECISION MAKING: Stable/uncomplicated ? ?EVALUATION COMPLEXITY: Low ? ? ?GOALS: ?Goals reviewed with patient? Yes ? ?LONG TERM GOALS: ? ?Pt will report improvement in FOTO DPS to >/= 62 and DFS to >/= 58 ?Baseline: DPS: 53, DFS: 53.1 ?Target date: 07/30/2021 ?Goal status: INITIAL ? ?2.  Pt will demonstrate independence with neck and vestibular home exercise program ?Baseline: no HEP to date ?Target date: 07/30/2021 ?Goal status: INITIAL ? ?3.  Pt will  demonstrate 10 degree improvement in pain free neck ROM to improve ability to return to performing household chores ?Baseline:  ?40 flexion  ?30 extension  ?30 pain R side (lat flexion R)  ?40 (lat flexion L)  ?60 pain R side (rotation R)  ?60 (rotation L)  ? ?Target date: 07/30/2021 ?Goal status: INITIAL ? ?4.  Pt will report no dizziness or increased HA when performing reaching down to the ground or reaching into low fridge, dryer or under bed ?Baseline: dizziness and pressure in head when performing ?Target date: 07/30/2021 ?Goal status: INITIAL ? ?PLAN: ?PT FREQUENCY: 1x/week ? ?PT DURATION: 6 weeks ? ?PLANNED INTERVENTIONS: Therapeutic exercises, Therapeutic activity, Neuromuscular re-education, Patient/Family education, Joint mobilization, Vestibular training, Canalith repositioning, Dry Needling, Spinal mobilization, Cryotherapy, Moist heat, Taping, and Manual therapy ? ?PLAN FOR NEXT SESSION: Assess combination of neck movements - down with rotation; initiate neck HEP.  Educate on dry needling - would pt like to try? ? ? ?Dierdre Highman, PT, DPT ?06/18/21    3:46 PM ? ?Managed medicaid CPT codes: 19509-  Therapeutic Exercise, [phone]- Neuro Re-education, [phone] - Manual Therapy, [address] - Therapeutic Activities, [phone] - Self Care, and [phone] - Canalith Repositioning  ?

## 2021-06-25 NOTE — Progress Notes (Unsigned)
[facility] [address] [place] [address] Phone: [phone] Subjective:    I'm seeing this patient by the request  of:  Olive Bass, FNP  CC:   [id number]  Jody Sandoval is a 41 y.o. female coming in with complaint of R elbow pain and R hand numbness. Patient states       Past Medical History:  Diagnosis Date   DVT (deep venous thrombosis) (HCC) 12/17/2008   Pulmonary embolism [facility])    Past Surgical History:  Procedure Laterality Date   ABDOMINAL HYSTERECTOMY  09/03/2020   CESAREAN SECTION  04/18/2002   TONSILLECTOMY     Social History   Socioeconomic History   Marital status: Significant Other    Spouse name: Not on file   Number of children: 3   Years of education: college   Highest education level: Bachelor's degree (e.g., BA, AB, BS)  Occupational History   Occupation: helps prenatal mothers w/ resources  Tobacco Use   Smoking status: Never   Smokeless tobacco: Not on file  Substance and Sexual Activity   Alcohol use: No   Drug use: No   Sexual activity: Yes    Birth control/protection: Other-see comments  Other Topics Concern   Not on file  Social History Narrative   Lives at home children and their father.   Right-handed.   Caffeine use: 95 mg per day (energy shot).   Social Determinants of Health   Financial Resource Strain: Not on file  Food Insecurity: Not on file  Transportation Needs: Not on file  Physical Activity: Not on file  Stress: Not on file  Social Connections: Not on file   Allergies  Allergen Reactions   Nifedipine Swelling   Lisinopril Other (See Comments)    Cough Cough    Tape Itching    Blue tape   Ferric Carboxymaltose Itching   Family History  Problem Relation Age of Onset   Hypertension Mother    Diabetes Mother    Hypertension Father    Heart attack Father      Current Outpatient Medications (Cardiovascular):    hydrochlorothiazide (HYDRODIURIL)  12.5 MG tablet, Take 1 tablet by mouth daily.   propranolol (INDERAL) 10 MG tablet, Take 10 mg by mouth 2 (two) times daily as needed.   rosuvastatin (CRESTOR) 5 MG tablet, Take 1 tablet (5 mg total) by mouth daily.   valsartan (DIOVAN) 160 MG tablet, Take 160 mg by mouth daily.    Current Outpatient Medications (Hematological):    ELIQUIS 5 MG TABS tablet, Take 5 mg by mouth 2 (two) times daily.  Current Outpatient Medications (Other):    escitalopram (LEXAPRO) 20 MG tablet, Take 20 mg by mouth daily.   potassium chloride (MICRO-K) 10 MEQ CR capsule, TAKE 1 CAPSULE(10 MEQ) BY MOUTH DAILY   Reviewed prior external information including notes and imaging from  primary care provider As well as notes that were available from care everywhere and other healthcare systems.  Past medical history, social, surgical and family history all reviewed in electronic medical record.  No pertanent information unless stated regarding to the chief complaint.   Review of Systems:  No headache, visual changes, nausea, vomiting, diarrhea, constipation, dizziness, abdominal pain, skin rash, fevers, chills, night sweats, weight loss, swollen lymph nodes, body aches, joint swelling, chest pain, shortness of breath, mood changes. POSITIVE muscle aches  Objective  Last menstrual period 01/24/2011, unknown if currently breastfeeding.   General: No apparent distress alert  and oriented x3 mood and affect normal, dressed appropriately.  HEENT: Pupils equal, extraocular movements intact  Respiratory: Patient's speak in full sentences and does not appear short of breath  Cardiovascular: No lower extremity edema, non tender, no erythema  Gait normal with good balance and coordination.  MSK:  Non tender with full range of motion and good stability and symmetric strength and tone of shoulders, elbows, wrist, hip, knee and ankles bilaterally.     Impression and Recommendations:     The above documentation has been  reviewed and is accurate and complete Wilford Grist

## 2021-06-30 ENCOUNTER — Ambulatory Visit (INDEPENDENT_AMBULATORY_CARE_PROVIDER_SITE_OTHER): Payer: Medicaid Other | Admitting: Family Medicine

## 2021-06-30 ENCOUNTER — Ambulatory Visit: Payer: Self-pay

## 2021-06-30 ENCOUNTER — Encounter: Payer: Self-pay | Admitting: Family Medicine

## 2021-06-30 ENCOUNTER — Other Ambulatory Visit: Payer: Self-pay

## 2021-06-30 VITALS — BP 138/84 | HR 79 | Ht 67.0 in | Wt 265.8 lb

## 2021-06-30 DIAGNOSIS — M25521 Pain in right elbow: Secondary | ICD-10-CM

## 2021-06-30 DIAGNOSIS — G5601 Carpal tunnel syndrome, right upper limb: Secondary | ICD-10-CM | POA: Diagnosis not present

## 2021-06-30 DIAGNOSIS — G8929 Other chronic pain: Secondary | ICD-10-CM

## 2021-06-30 MED ORDER — GABAPENTIN 100 MG PO CAPS: 200.0000 mg | ORAL_CAPSULE | Freq: Every day | ORAL | 3 refills | Status: DC

## 2021-06-30 NOTE — Assessment & Plan Note (Signed)
Patient signs and symptoms is more consistent with carpal tunnel syndrome.  Discussed with patient about icing regimen and home exercises.  Discussed with patient about bracing.  I also started on gabapentin.  Warned of any potential side effects.  Hopefully patient will respond well to this.  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  Follow-up with me again in 4 to 6 weeks.  Worsening pain consider occupational therapy and injections and differential and does include cervical radiculopathy but less likelihood. ?

## 2021-06-30 NOTE — Patient Instructions (Addendum)
Good to see you ? ?I've sent a prescription for Gabapentin to your pharmacy. Take this at night.  ? ?Carpal wrist brace both day and night for 2 weeks. And then just at night for the next 2 weeks. ? ?Please complete the exercises that the athletic trainer went over with you:  View at [email or web address] using code: [email or web address] ? ?See me again in 4-5 weeks  ?

## 2021-07-02 ENCOUNTER — Ambulatory Visit: Payer: Medicaid Other

## 2021-07-02 ENCOUNTER — Other Ambulatory Visit: Payer: Self-pay

## 2021-07-02 DIAGNOSIS — M542 Cervicalgia: Secondary | ICD-10-CM

## 2021-07-02 DIAGNOSIS — G4452 New daily persistent headache (NDPH): Secondary | ICD-10-CM | POA: Diagnosis not present

## 2021-07-02 DIAGNOSIS — R42 Dizziness and giddiness: Secondary | ICD-10-CM

## 2021-07-02 NOTE — Therapy (Signed)
?OUTPATIENT PHYSICAL THERAPY VESTIBULAR TREATMENT NOTE ? ? ?Patient Name: Jody Sandoval ?MRN: [id number] ?DOB:1980/08/07, 41 y.o., female ?Today's Date: 07/02/2021 ? ?PCP: Olive Bass, FNP ?REFERRING PROVIDER: Ocie Doyne, MD ? ? PT End of Session - 07/02/21 0943   ? ? Visit Number 2   ? Number of Visits 7   ? Date for PT Re-Evaluation 08/06/21   ? Authorization Type Medicaid   ? Authorization - Visit Number 1   ? Authorization - Number of Visits 6   ? PT Start Time [phone]   patient arrived late  ? PT Stop Time 1014   ? PT Time Calculation (min) 32 min   ? Activity Tolerance Patient tolerated treatment well   ? Behavior During Therapy [facility] for tasks assessed/performed   ? ?  ?  ? ?  ? ? ?Past Medical History:  ?Diagnosis Date  ? DVT (deep venous thrombosis) (HCC) 12/17/2008  ? Pulmonary embolism (HCC)   ? ?Past Surgical History:  ?Procedure Laterality Date  ? ABDOMINAL HYSTERECTOMY  09/03/2020  ? CESAREAN SECTION  04/18/2002  ? TONSILLECTOMY    ? ?Patient Active Problem List  ? Diagnosis Date Noted  ? Right carpal tunnel syndrome 06/30/2021  ? 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 ? ?ONSET DATE: 06/04/21 ? ?REFERRING DIAG: R42 (ICD-10-CM) - Vertigo ?M54.2 (ICD-10-CM) - Cervicalgia ? ?THERAPY DIAG:  ?Cervicalgia ? ?Dizziness and giddiness ? ?New daily persistent headache ? ?PERTINENT HISTORY: depression, DVT ? ?PRECAUTIONS: None ? ?SUBJECTIVE: Late because she got a speeding ticket on the way to therapy session. Patient reports dizziness and HA has been better. Reports that had HA approx 3-4 days. Received brace for the wrist and this had helped improve the pain. Reports that the sensation feels like it has also improved in the hand.  ? ?PAIN:  ?Are you having pain? No ? ?OBJECTIVE:  ? ?PT TREATMENT: ? Completed Cervical-Flexion Rotation Test: pain elicited with rotation to the left side. Decreased rotation noted to bilateral directions.  ? ? Manual Therapy:  ?Completed STM to B Levator Scap and B  Suboccipitals x 5 minutes, with trigger point release to B suboccipitals. Patient TTP on bilateral sides. With mild HA elicited. ? ?  PT completed manual stretching B Upper Trap with overpressure provided to patients tolerance, completed 2 x 30 seconds.  ? ? Established Initial HEP: ?Access Code: [email or web address] ?URL: [email or web address] ?Date: 07/02/2021 ?Prepared by: Jethro Bastos ? ?Exercises ?Seated Levator Scapulae Stretch - 2 x daily - 5 x weekly - 1 sets - 2 reps - 30 seconds hold ?Seated Assisted Cervical Rotation with Towel - 1 x daily - 5 x weekly - 1 sets - 5 reps - 10-15 seconds hold ? ? ?  ?PATIENT EDUCATION: ?Education details: Dry Needling Potential Next Visit, Initial HEP ?Person educated: Patient ?Education method: Explanation ?Education comprehension: verbalized understanding ?  ?ASSESSMENT: ?  ?CLINICAL IMPRESSION: ?Session limited due to patient arriving late today. Pain elicited with cervical flexion rotation test, specifically noted to L with decreased rotation noted. Mild HA with suboccipital release. Established initial HEP focused on cervical area. Pt interested in Dry Needling services at this time, will address at next visit.  ?  ?OBJECTIVE IMPAIRMENTS decreased activity tolerance, decreased ROM, dizziness, and pain.  ?  ?ACTIVITY LIMITATIONS cleaning, meal prep, occupation, laundry, and shopping.  ?  ?PERSONAL FACTORS Past/current experiences, Profession, Time since onset of injury/illness/exacerbation, and 1-2 comorbidities: HA x 6 months, DVT, PE, depression, HTN, HLD  are also affecting patient's functional  outcome.  ?  ?  ?REHAB POTENTIAL: Good ?  ?CLINICAL DECISION MAKING: Stable/uncomplicated ?  ?EVALUATION COMPLEXITY: Low ?  ?  ?GOALS: ?Goals reviewed with patient? Yes ?  ?LONG TERM GOALS: ?  ?Pt will report improvement in FOTO DPS to >/= 62 and DFS to >/= 58 ?Baseline: DPS: 53, DFS: 53.1 ?Target date: 07/30/2021 ?Goal status: INITIAL ?  ?2.  Pt will demonstrate  independence with neck and vestibular home exercise program ?Baseline: no HEP to date ?Target date: 07/30/2021 ?Goal status: INITIAL ?  ?3.  Pt will demonstrate 10 degree improvement in pain free neck ROM to improve ability to return to performing household chores ?Baseline:  ?40 flexion  ?30 extension  ?30 pain R side (lat flexion R)  ?40 (lat flexion L)  ?60 pain R side (rotation R)  ?60 (rotation L)  ?  ?Target date: 07/30/2021 ?Goal status: INITIAL ?  ?4.  Pt will report no dizziness or increased HA when performing reaching down to the ground or reaching into low fridge, dryer or under bed ?Baseline: dizziness and pressure in head when performing ?Target date: 07/30/2021 ?Goal status: INITIAL ?  ?PLAN: ?PT FREQUENCY: 1x/week ?  ?PT DURATION: 6 weeks ?  ?PLANNED INTERVENTIONS: Therapeutic exercises, Therapeutic activity, Neuromuscular re-education, Patient/Family education, Joint mobilization, Vestibular training, Canalith repositioning, Dry Needling, Spinal mobilization, Cryotherapy, Moist heat, Taping, and Manual therapy ?  ?PLAN FOR NEXT SESSION:  Review HEP. Continue Manual Therapy. Try DN and assess response.  ? ? ?Tempie Donning, PT, DPT ?07/02/2021, 12:15 PM ? ? ?   ? ?

## 2021-07-02 NOTE — Patient Instructions (Signed)
Access Code: [email or web address] ?URL: [email or web address] ?Date: 07/02/2021 ?Prepared by: Jethro Bastos ? ?Exercises ?Seated Levator Scapulae Stretch - 2 x daily - 5 x weekly - 1 sets - 2 reps - 30 seconds hold ?Seated Assisted Cervical Rotation with Towel - 1 x daily - 5 x weekly - 1 sets - 5 reps - 10-15 seconds hold ? ?

## 2021-07-09 ENCOUNTER — Ambulatory Visit: Payer: Medicaid Other | Admitting: Physical Therapy

## 2021-07-09 ENCOUNTER — Other Ambulatory Visit: Payer: Self-pay

## 2021-07-09 DIAGNOSIS — M542 Cervicalgia: Secondary | ICD-10-CM | POA: Diagnosis not present

## 2021-07-09 DIAGNOSIS — G4452 New daily persistent headache (NDPH): Secondary | ICD-10-CM | POA: Diagnosis not present

## 2021-07-09 DIAGNOSIS — R42 Dizziness and giddiness: Secondary | ICD-10-CM | POA: Diagnosis not present

## 2021-07-09 NOTE — Therapy (Signed)
?OUTPATIENT PHYSICAL THERAPY VESTIBULAR TREATMENT NOTE ? ? ?Patient Name: Jody Sandoval ?MRN: [id number] ?DOB:1980-10-15, 41 y.o., female ?Today's Date: 07/09/2021 ? ?PCP: Marrian Salvage, [place] ?REFERRING PROVIDER: Genia Harold, MD ? ? PT End of Session - 07/09/21 1032   ? ? Visit Number 3   ? Number of Visits 7   ? Date for PT Re-Evaluation 08/06/21   ? Authorization Type Medicaid   ? Authorization - Visit Number 2   ? Authorization - Number of Visits 6   ? PT Start Time 1032   ? PT Stop Time 1115   ? PT Time Calculation (min) 43 min   ? Activity Tolerance Patient tolerated treatment well   ? Behavior During Therapy [facility] for tasks assessed/performed   ? ?  ?  ? ?  ? ? ?Past Medical History:  ?Diagnosis Date  ? DVT (deep venous thrombosis) ([place]) 12/17/2008  ? Pulmonary embolism ([place])   ? ?Past Surgical History:  ?Procedure Laterality Date  ? ABDOMINAL HYSTERECTOMY  09/03/2020  ? CESAREAN SECTION  04/18/2002  ? TONSILLECTOMY    ? ?Patient Active Problem List  ? Diagnosis Date Noted  ? Right carpal tunnel syndrome 06/30/2021  ? 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 ? ?ONSET DATE: 06/04/21 ? ?REFERRING DIAG: R42 (ICD-10-CM) - Vertigo ?M54.2 (ICD-10-CM) - Cervicalgia ? ?THERAPY DIAG:  ?Cervicalgia ? ?Dizziness and giddiness ? ?New daily persistent headache ? ?PERTINENT HISTORY: depression, DVT ? ?PRECAUTIONS: None ? ?SUBJECTIVE: Feels like things are getting better!  Was having HA every day; now only having 3/7 days.  Exercises are going well.  Would like to learn more about dry needling. ? ?PAIN:  ?Are you having pain? No ? ?OBJECTIVE:  ? ?PT TREATMENT: ? ?07/09/2021: ? ? 07/09/21 1243  ?Manual Therapy  ?Manual Therapy Joint mobilization;Soft tissue mobilization;Myofascial release;Passive ROM;Muscle Energy Technique  ?Manual therapy comments Performed in supine after dry needling.  ?Joint Mobilization Performed Grade II upglides and contralateral downglides along upper, middle, lower cervical spine to improve  rotation ROM; pt reported decreased HA after joint mobilization  ?Soft tissue mobilization STM performed to R and L UT and R temporalis muscle to improve tissue mobility and decrease pain  ?Myofascial Release bilat suboccipital release to increase upper cervical spine mobility  ?Passive ROM Performed passive ROM to R and L UT with shoulder depression after dry needling.  combined with mm energy technique below; performed 5 reps each side - PT provided passive rotation to L or R progressively increasing ROM each repetition with pt performing resisted rotation back to midline and contralateral side x 5 reps each side.  ?Muscle Energy Technique Contract-relax followed by PROM as described above to increase cervical spine rotation ROM.  Cues to decrease compensatory lateral flexion on L side  ? ? ? 07/09/21 1242  ?Trigger Point Dry Needling  ?Consent Given? Yes  ?Education Handout Provided Yes  ?Muscles Treated Head and Neck Upper trapezius  ?Dry Needling Comments Performed to L and R side; performed in supine  ?Other Dry Needling May benefit from dry needling to R temporalis mm due to reproduction of HA pain with palpation on R side  ?Upper Trapezius Response Twitch reponse elicited;Palpable increased muscle length ?(no twitches noted on R side; main response on L side)  ? ? ? ?07/02/21: ?Completed Cervical-Flexion Rotation Test: pain elicited with rotation to the left side. Decreased rotation noted to bilateral directions.  ? ? Manual Therapy:  ?Completed STM to B Levator Scap and B Suboccipitals x 5  minutes, with trigger point release to B suboccipitals. Patient TTP on bilateral sides. With mild HA elicited. ? ?PT completed manual stretching B Upper Trap with overpressure provided to patients tolerance, completed 2 x 30 seconds.  ? ? Established Initial HEP: ?Access Code: [email or web address] ?URL: [email or web address] ?Date: 07/02/2021 ?Prepared by: Baldomero Lamy ? ?Exercises ?Seated Levator Scapulae Stretch - 2  x daily - 5 x weekly - 1 sets - 2 reps - 30 seconds hold ?Seated Assisted Cervical Rotation with Towel - 1 x daily - 5 x weekly - 1 sets - 5 reps - 10-15 seconds hold ? ?  ?PATIENT EDUCATION: ?Education details: Reviewed dry needling handout, screened for precautions and contraindications, discussed side effects and ways to mitigate side effects. ?Person educated: Patient ?Education method: Explanation ?Education comprehension: verbalized understanding ?  ?ASSESSMENT: ?  ?CLINICAL IMPRESSION:  Continued to address headaches, limited neck ROM and neck pain with trigger point dry needling of UT, manual therapy and neck mobility exercises.  Pt reported improvement in HA at end of session and denied any dizziness.  ?  ?OBJECTIVE IMPAIRMENTS decreased activity tolerance, decreased ROM, dizziness, and pain.  ?  ?ACTIVITY LIMITATIONS cleaning, meal prep, occupation, laundry, and shopping.  ?  ?PERSONAL FACTORS Past/current experiences, Profession, Time since onset of injury/illness/exacerbation, and 1-2 comorbidities: HA x 6 months, DVT, PE, depression, HTN, HLD  are also affecting patient's functional outcome.  ?  ?REHAB POTENTIAL: Good ?  ?CLINICAL DECISION MAKING: Stable/uncomplicated ?  ?EVALUATION COMPLEXITY: Low ?  ?  ?GOALS: ?Goals reviewed with patient? Yes ?  ?LONG TERM GOALS: ?  ?Pt will report improvement in FOTO DPS to >/= 62 and DFS to >/= 58 ?Baseline: DPS: 53, DFS: 53.1 ?Target date: 07/30/2021 ?Goal status: INITIAL ?  ?2.  Pt will demonstrate independence with neck and vestibular home exercise program ?Baseline: no HEP to date ?Target date: 07/30/2021 ?Goal status: INITIAL ?  ?3.  Pt will demonstrate 10 degree improvement in pain free neck ROM to improve ability to return to performing household chores ?Baseline:  ?40 flexion  ?30 extension  ?30 pain R side (lat flexion R)  ?40 (lat flexion L)  ?60 pain R side (rotation R)  ?60 (rotation L)  ?  ?Target date: 07/30/2021 ?Goal status: INITIAL ?  ?4.  Pt will  report no dizziness or increased HA when performing reaching down to the ground or reaching into low fridge, dryer or under bed ?Baseline: dizziness and pressure in head when performing ?Target date: 07/30/2021 ?Goal status: INITIAL ?  ?PLAN: ?PT FREQUENCY: 1x/week ?  ?PT DURATION: 6 weeks ?  ?PLANNED INTERVENTIONS: Therapeutic exercises, Therapeutic activity, Neuromuscular re-education, Patient/Family education, Joint mobilization, Vestibular training, Canalith repositioning, Dry Needling, Spinal mobilization, Cryotherapy, Moist heat, Taping, and Manual therapy ?  ?PLAN FOR NEXT SESSION:  Please offer to change 4/5 appt to 4/6 at 9:30 with Demeisha Geraghty for one more DN of temporalis on R.  Continue manual therapy to cervical spine; vestibular habituation exercises - especially bending down to the floor. ? ? ?Rico Junker, PT, DPT ?07/09/21    12:51 PM ? ? ? ? ?   ? ?

## 2021-07-09 NOTE — Patient Instructions (Signed)

## 2021-07-13 ENCOUNTER — Ambulatory Visit: Payer: Medicaid Other | Admitting: Family Medicine

## 2021-07-13 ENCOUNTER — Encounter: Payer: Self-pay | Admitting: Family Medicine

## 2021-07-13 ENCOUNTER — Other Ambulatory Visit (HOSPITAL_COMMUNITY)
Admission: RE | Admit: 2021-07-13 | Discharge: 2021-07-13 | Disposition: A | Discharge status: Home / Self Care | Payer: Medicaid Other | Source: Ambulatory Visit | Attending: Family Medicine | Admitting: Family Medicine

## 2021-07-13 VITALS — BP 136/78 | HR 82 | Ht 67.0 in | Wt 268.0 lb

## 2021-07-13 DIAGNOSIS — N898 Other specified noninflammatory disorders of vagina: Secondary | ICD-10-CM | POA: Diagnosis not present

## 2021-07-13 DIAGNOSIS — R3989 Other symptoms and signs involving the genitourinary system: Secondary | ICD-10-CM | POA: Diagnosis not present

## 2021-07-13 DIAGNOSIS — N3000 Acute cystitis without hematuria: Secondary | ICD-10-CM | POA: Diagnosis not present

## 2021-07-13 LAB — POC URINALSYSI DIPSTICK (AUTOMATED)
Bilirubin, UA: NEGATIVE
Blood, UA: NEGATIVE
Glucose, UA: NEGATIVE
Ketones, UA: NEGATIVE
Leukocytes, UA: NEGATIVE
Nitrite, UA: POSITIVE
Protein, UA: NEGATIVE
Spec Grav, UA: 1.025 (ref 1.010–1.025)
Urobilinogen, UA: 0.2 E.U./dL
pH, UA: 5 (ref 5.0–8.0)

## 2021-07-13 NOTE — Progress Notes (Signed)
Brown urine ?

## 2021-07-13 NOTE — Progress Notes (Signed)
? ?Acute Office Visit ? ?Subjective:  ? ? Patient ID: Jody Sandoval, female    DOB: 06-Oct-1980, 41 y.o.   MRN: [id number] ? ?CC: UTI symptoms ? ? ?Urinary Tract Infection  ?This is a new problem. The current episode started yesterday. Quality: bladder fullness, unable to fully empty. The pain is at a severity of 0/10. There has been no fever. She is Sexually active. Associated symptoms include a discharge. Pertinent negatives include no chills, flank pain, frequency, hematuria, hesitancy, nausea, possible pregnancy, sweats, urgency or vomiting. She has tried nothing for the symptoms. The treatment provided no relief.  ?Dark urine this morning, has since resolved. She does not drink a lot of water at baseline. Vaginal discharge is white "pasty"; no vaginal itching, burning, pain, bleeding, etc. She denies any other symptoms, but would like to add STD testing to swab.  ? ? ? ? ?Past Medical History:  ?Diagnosis Date  ? DVT (deep venous thrombosis) (HCC) 12/17/2008  ? Pulmonary embolism (HCC)   ? ? ?Past Surgical History:  ?Procedure Laterality Date  ? ABDOMINAL HYSTERECTOMY  09/03/2020  ? CESAREAN SECTION  04/18/2002  ? TONSILLECTOMY    ? ? ?Family History  ?Problem Relation Age of Onset  ? Hypertension Mother   ? Diabetes Mother   ? Hypertension Father   ? Heart attack Father   ? ? ?Social History  ? ?Socioeconomic History  ? Marital status: Significant Other  ?  Spouse name: Not on file  ? Number of children: 3  ? Years of education: college  ? Highest education level: Bachelor's degree (e.g., BA, AB, BS)  ?Occupational History  ? Occupation: helps prenatal mothers w/ resources  ?Tobacco Use  ? Smoking status: Never  ? Smokeless tobacco: Not on file  ?Substance and Sexual Activity  ? Alcohol use: No  ? Drug use: No  ? Sexual activity: Yes  ?  Birth control/protection: Other-see comments  ?Other Topics Concern  ? Not on file  ?Social History Narrative  ? Lives at home children and their father.  ? Right-handed.  ?  Caffeine use: 95 mg per day (energy shot).  ? ?Social Determinants of Health  ? ?Financial Resource Strain: Not on file  ?Food Insecurity: Not on file  ?Transportation Needs: Not on file  ?Physical Activity: Not on file  ?Stress: Not on file  ?Social Connections: Not on file  ?Intimate Partner Violence: Not on file  ? ? ?Outpatient Medications Prior to Visit  ?Medication Sig Dispense Refill  ? ELIQUIS 5 MG TABS tablet Take 5 mg by mouth 2 (two) times daily.    ? escitalopram (LEXAPRO) 20 MG tablet Take 20 mg by mouth daily.    ? hydrochlorothiazide (HYDRODIURIL) 12.5 MG tablet Take 1 tablet by mouth daily.    ? potassium chloride (MICRO-K) 10 MEQ CR capsule TAKE 1 CAPSULE(10 MEQ) BY MOUTH DAILY 90 capsule 1  ? propranolol (INDERAL) 10 MG tablet Take 10 mg by mouth 2 (two) times daily as needed.    ? rosuvastatin (CRESTOR) 5 MG tablet Take 1 tablet (5 mg total) by mouth daily. 90 tablet 1  ? valsartan (DIOVAN) 160 MG tablet Take 160 mg by mouth daily.    ? gabapentin (NEURONTIN) 100 MG capsule Take 2 capsules (200 mg total) by mouth at bedtime. 60 capsule 3  ? ?No facility-administered medications prior to visit.  ? ? ?Allergies  ?Allergen Reactions  ? Nifedipine Swelling  ? Lisinopril Other (See Comments)  ?  Cough ?  Cough ?  ? Tape Itching  ?  Blue tape  ? Ferric Carboxymaltose Itching  ? ? ?Review of Systems ?All review of systems negative except what is listed in the HPI ? ? ?   ?Objective:  ?  ?Physical Exam ?Vitals reviewed.  ?Constitutional:   ?   Appearance: Normal appearance.  ?Cardiovascular:  ?   Rate and Rhythm: Normal rate and regular rhythm.  ?Pulmonary:  ?   Effort: Pulmonary effort is normal.  ?   Breath sounds: Normal breath sounds.  ?Abdominal:  ?   General: Abdomen is flat.  ?   Palpations: Abdomen is soft. There is no mass.  ?   Tenderness: There is no abdominal tenderness. There is no right CVA tenderness, left CVA tenderness or guarding.  ?Skin: ?   General: Skin is warm and dry.  ?Neurological:   ?   General: No focal deficit present.  ?   Mental Status: She is alert and oriented to person, place, and time.  ?Psychiatric:     ?   Mood and Affect: Mood normal.     ?   Behavior: Behavior normal.     ?   Thought Content: Thought content normal.     ?   Judgment: Judgment normal.  ? ? ?BP 136/78   Pulse 82   Ht 5\' 7"  (1.702 m)   Wt 268 lb (121.6 kg)   LMP 01/24/2011   BMI 41.97 kg/m?  ?Wt Readings from Last 3 Encounters:  ?07/13/21 268 lb (121.6 kg)  ?06/30/21 265 lb 12.8 oz (120.6 kg)  ?06/04/21 266 lb 9.6 oz (120.9 kg)  ? ? ?Health Maintenance Due  ?Topic Date Due  ? HIV Screening  Never done  ? Hepatitis C Screening  Never done  ? PAP SMEAR-Modifier  12/18/2012  ? COVID-19 Vaccine (2 - Pfizer series) 01/17/2020  ? ? ?There are no preventive care reminders to display for this patient. ? ? ?Lab Results  ?Component Value Date  ? TSH 0.55 12/18/2009  ? ?Lab Results  ?Component Value Date  ? WBC 4.8 12/18/2009  ? HGB 12.4 12/18/2009  ? HCT 36.8 12/18/2009  ? MCV 93.5 12/18/2009  ? PLT 223.0 12/18/2009  ? ?Lab Results  ?Component Value Date  ? NA 136 12/18/2009  ? K 4.0 12/18/2009  ? CO2 25 12/18/2009  ? GLUCOSE 80 12/18/2009  ? BUN 11 12/18/2009  ? CREATININE 0.7 12/18/2009  ? BILITOT 0.4 12/18/2009  ? ALKPHOS 52 12/18/2009  ? AST 18 12/18/2009  ? ALT 19 12/18/2009  ? PROT 6.7 12/18/2009  ? ALBUMIN 3.8 12/18/2009  ? CALCIUM 9.1 12/18/2009  ? ?Lab Results  ?Component Value Date  ? CHOL 249 (H) 12/18/2009  ? ?Lab Results  ?Component Value Date  ? HDL 56.00 12/18/2009  ? ?No results found for: LDLCALC ?Lab Results  ?Component Value Date  ? TRIG 54.0 12/18/2009  ? ?Lab Results  ?Component Value Date  ? CHOLHDL 4 12/18/2009  ? ?No results found for: HGBA1C ? ?   ?Assessment & Plan:  ? ?1. Abnormal urine color ?UA with nitrites, otherwise clear. Will send for culture. No significant symptoms currently, will wait on culture results. Recommended increasing water intake.  ? ?- POCT Urinalysis Dipstick (Automated) ?-  Urine Culture ? ?2. Vaginal discharge ?She is concerned about possible yeast infection. Would like to swab today and add STD screening. No further testing requested at this time. Will update her with results and plan.  ? ?-  Cervicovaginal ancillary only ? ?Please contact office for follow-up if symptoms do not improve or worsen. Seek emergency care if symptoms become severe. ?Patient aware of signs/symptoms requiring further/urgent evaluation. ? ? ?Clayborne Danaaylor B Ilamae Geng, NP ? ?

## 2021-07-14 ENCOUNTER — Other Ambulatory Visit: Payer: Self-pay

## 2021-07-14 ENCOUNTER — Ambulatory Visit: Payer: Medicaid Other

## 2021-07-14 DIAGNOSIS — R42 Dizziness and giddiness: Secondary | ICD-10-CM

## 2021-07-14 DIAGNOSIS — G4452 New daily persistent headache (NDPH): Secondary | ICD-10-CM

## 2021-07-14 DIAGNOSIS — M542 Cervicalgia: Secondary | ICD-10-CM | POA: Diagnosis not present

## 2021-07-14 LAB — CERVICOVAGINAL ANCILLARY ONLY
Bacterial Vaginitis (gardnerella): NEGATIVE
Candida Glabrata: NEGATIVE
Candida Vaginitis: NEGATIVE
Chlamydia: NEGATIVE
Comment: NEGATIVE
Comment: NEGATIVE
Comment: NEGATIVE
Comment: NEGATIVE
Comment: NEGATIVE
Comment: NORMAL
Neisseria Gonorrhea: NEGATIVE
Trichomonas: NEGATIVE

## 2021-07-14 NOTE — Therapy (Signed)
?OUTPATIENT PHYSICAL THERAPY VESTIBULAR TREATMENT NOTE ? ? ?Patient Name: Jody Sandoval ?MRN: [id number] ?DOB:[birth year], 41 y.o., female ?Today's Date: 07/14/2021 ? ?PCP: Marrian Salvage, [place] ?REFERRING PROVIDER: Genia Harold, MD ? ? PT End of Session - 07/14/21 1103   ? ? Visit Number 4   ? Number of Visits 7   ? Date for PT Re-Evaluation 08/06/21   ? Authorization Type Medicaid   ? Authorization - Visit Number 3   ? Authorization - Number of Visits 6   ? PT Start Time 1103   ? PT Stop Time [id number]   ? PT Time Calculation (min) 40 min   ? Activity Tolerance Patient tolerated treatment well   ? Behavior During Therapy [facility] for tasks assessed/performed   ? ?  ?  ? ?  ? ? ?Past Medical History:  ?Diagnosis Date  ? DVT (deep venous thrombosis) ([place]) 12/17/2008  ? Pulmonary embolism ([place])   ? ?Past Surgical History:  ?Procedure Laterality Date  ? ABDOMINAL HYSTERECTOMY  09/03/2020  ? CESAREAN SECTION  04/18/2002  ? TONSILLECTOMY    ? ?Patient Active Problem List  ? Diagnosis Date Noted  ? Right carpal tunnel syndrome 06/30/2021  ? 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 ? ?ONSET DATE: 06/04/21 ? ?REFERRING DIAG: R42 (ICD-10-CM) - Vertigo ?M54.2 (ICD-10-CM) - Cervicalgia ? ?THERAPY DIAG:  ?Cervicalgia ? ?Dizziness and giddiness ? ?New daily persistent headache ? ?PERTINENT HISTORY: depression, DVT ? ?PRECAUTIONS: None ? ?SUBJECTIVE: Patient reports that she had soreness for about two weeks, reports felt pretty intense. No other new change/complaints. Feel the headaches have been more manageable.  ? ?PAIN:  ?Are you having pain? Yes: NPRS scale: 3/10 ?Pain location: Posterior Head ?Pain description: Headache ? ? ?OBJECTIVE:  ? ?PT TREATMENT: ? ?Manual Therapy:  ?- STM performed to R and L UT, R and L Levator, and R and L suboccipitals muscles to improve tissue mobility and decrease pain ?- Bilat suboccipital release to increase upper cervical spine mobility, completed x 3 reps each with gentle traction provided ?-  Performed passive ROM to R and L UT with shoulder depression, progressing into range for improved ROM and stretch of region. Completed passive bilat UT stretch 3 x 30 seconds. ?- Transitioned into contract - relax technique with bilat UT with breathing to promote relaxation and improved mobility.  ?- Seated: completed lateral flexion stretch to bilat directions, 2 x 30 seconds with PT providing shoulder depression for improved stretch ? ?TherEx:  ? - Supine: Completed cervical retraction x 10 reps with 3 second isometric hold.  ? - Seated: completed scapular retraction x 10 reps with PT providing for cues, initially engaging premature UT with completion and focused on breathing techniques to promote improved muscle recruitment. Added to HEP ?  ? ?  ?PATIENT EDUCATION: ?Education details: HEP Update ?Person educated: Patient ?Education method: Explanation ?Education comprehension: verbalized understanding ?  ?ASSESSMENT: ?  ?CLINICAL IMPRESSION:  Continued to address headaches, muscle tension, and neck ROM with manual therapy and There-Ex during today's session. Continue to demo increased tension on R > L side but improvements noted. Patient started with HA and resolution noted with manual therapy. Will continue per POC.  ?  ?OBJECTIVE IMPAIRMENTS decreased activity tolerance, decreased ROM, dizziness, and pain.  ?  ?ACTIVITY LIMITATIONS cleaning, meal prep, occupation, laundry, and shopping.  ?  ?PERSONAL FACTORS Past/current experiences, Profession, Time since onset of injury/illness/exacerbation, and 1-2 comorbidities: HA x 6 months, DVT, PE, depression, HTN, HLD  are also affecting  patient's functional outcome.  ?  ?REHAB POTENTIAL: Good ?  ?CLINICAL DECISION MAKING: Stable/uncomplicated ?  ?EVALUATION COMPLEXITY: Low ?  ?  ?GOALS: ?Goals reviewed with patient? Yes ?  ?LONG TERM GOALS: ?  ?Pt will report improvement in FOTO DPS to >/= 62 and DFS to >/= 58 ?Baseline: DPS: 53, DFS: 53.1 ?Target date:  07/30/2021 ?Goal status: INITIAL ?  ?2.  Pt will demonstrate independence with neck and vestibular home exercise program ?Baseline: no HEP to date ?Target date: 07/30/2021 ?Goal status: INITIAL ?  ?3.  Pt will demonstrate 10 degree improvement in pain free neck ROM to improve ability to return to performing household chores ?Baseline:  ?40 flexion  ?30 extension  ?30 pain R side (lat flexion R)  ?40 (lat flexion L)  ?60 pain R side (rotation R)  ?60 (rotation L)  ?  ?Target date: 07/30/2021 ?Goal status: INITIAL ?  ?4.  Pt will report no dizziness or increased HA when performing reaching down to the ground or reaching into low fridge, dryer or under bed ?Baseline: dizziness and pressure in head when performing ?Target date: 07/30/2021 ?Goal status: INITIAL ?  ?PLAN: ?PT FREQUENCY: 1x/week ?  ?PT DURATION: 6 weeks ?  ?PLANNED INTERVENTIONS: Therapeutic exercises, Therapeutic activity, Neuromuscular re-education, Patient/Family education, Joint mobilization, Vestibular training, Canalith repositioning, Dry Needling, Spinal mobilization, Cryotherapy, Moist heat, Taping, and Manual therapy ?  ?PLAN FOR NEXT SESSION: Continue manual therapy to cervical spine; vestibular habituation exercises - especially bending down to the floor. How was HEP?  ? ? ?Jones Bales, PT, DPT ?07/14/21    11:56 AM ? ? ? ? ?   ? ?

## 2021-07-14 NOTE — Patient Instructions (Signed)
Access Code: [email or web address] ?URL: [email or web address] ?Date: 07/14/2021 ?Prepared by: Jethro Bastos ? ?Exercises ?- Seated Levator Scapulae Stretch  - 2 x daily - 5 x weekly - 1 sets - 2 reps - 30 seconds hold ?- Seated Assisted Cervical Rotation with Towel  - 1 x daily - 5 x weekly - 1 sets - 5 reps - 10-15 seconds hold ?- Seated Scapular Retraction  - 1 x daily - 5 x weekly - 1 sets - 10 reps - 3 seconds hold ?

## 2021-07-16 LAB — URINE CULTURE
MICRO NUMBER:: 13189900
SPECIMEN QUALITY:: ADEQUATE

## 2021-07-16 MED ORDER — SULFAMETHOXAZOLE-TRIMETHOPRIM 800-160 MG PO TABS: 1.0000 | ORAL_TABLET | Freq: Two times a day (BID) | ORAL | 0 refills | Status: AC

## 2021-07-16 NOTE — Addendum Note (Signed)
Addended by: Hyman Hopes B on: 07/16/2021 08:22 AM ? ? Modules accepted: Orders ? ?

## 2021-07-19 ENCOUNTER — Other Ambulatory Visit: Payer: Self-pay | Admitting: Family

## 2021-07-20 ENCOUNTER — Other Ambulatory Visit (HOSPITAL_BASED_OUTPATIENT_CLINIC_OR_DEPARTMENT_OTHER): Payer: Self-pay

## 2021-07-20 ENCOUNTER — Other Ambulatory Visit: Payer: Self-pay | Admitting: Family

## 2021-07-20 MED ORDER — HYDROCHLOROTHIAZIDE 12.5 MG PO TABS
12.5000 mg | ORAL_TABLET | Freq: Every day | ORAL | 1 refills | Status: DC
Start: 2021-07-20 — End: 2021-12-27
  Filled 2021-07-20: qty 90, 90d supply, fill #0
  Filled 2021-10-22: qty 90, 90d supply, fill #1

## 2021-07-20 MED ORDER — VALSARTAN 160 MG PO TABS
160.0000 mg | ORAL_TABLET | Freq: Every day | ORAL | 0 refills | Status: DC
Start: 2021-07-20 — End: 2021-08-20
  Filled 2021-07-20: qty 90, 90d supply, fill #0

## 2021-07-20 MED ORDER — ELIQUIS 5 MG PO TABS
5.0000 mg | ORAL_TABLET | Freq: Two times a day (BID) | ORAL | 11 refills | Status: DC
Start: 2021-07-20 — End: 2022-02-02
  Filled 2021-07-20: qty 60, 30d supply, fill #0
  Filled 2021-08-23: qty 60, 30d supply, fill #1
  Filled 2021-09-29: qty 60, 30d supply, fill #2
  Filled 2021-10-27: qty 60, 30d supply, fill #3
  Filled 2021-11-24: qty 60, 30d supply, fill #4
  Filled 2021-12-27: qty 60, 30d supply, fill #5
  Filled 2022-01-31: qty 60, 30d supply, fill #6

## 2021-07-21 ENCOUNTER — Ambulatory Visit: Payer: Medicaid Other | Attending: Psychiatry

## 2021-07-21 DIAGNOSIS — M542 Cervicalgia: Secondary | ICD-10-CM | POA: Diagnosis not present

## 2021-07-21 DIAGNOSIS — G4452 New daily persistent headache (NDPH): Secondary | ICD-10-CM | POA: Insufficient documentation

## 2021-07-21 DIAGNOSIS — R42 Dizziness and giddiness: Secondary | ICD-10-CM | POA: Diagnosis not present

## 2021-07-21 NOTE — Therapy (Signed)
?OUTPATIENT PHYSICAL THERAPY VESTIBULAR TREATMENT NOTE ? ? ?Patient Name: Jody Sandoval ?MRN: [id number] ?DOB:1980-06-06, 41 y.o., female ?Today's Date: 07/21/2021 ? ?PCP: Marrian Salvage, [place] ?REFERRING PROVIDER: Genia Harold, MD ? ? PT End of Session - 07/21/21 0939   ? ? Visit Number 5   ? Number of Visits 7   ? Date for PT Re-Evaluation 08/06/21   ? Authorization Type Medicaid   ? Authorization - Visit Number 4   ? Authorization - Number of Visits 6   ? PT Start Time [phone]   pt arrived late  ? PT Stop Time 1012   ? PT Time Calculation (min) 34 min   ? Activity Tolerance Patient tolerated treatment well   ? Behavior During Therapy [facility] for tasks assessed/performed   ? ?  ?  ? ?  ? ? ?Past Medical History:  ?Diagnosis Date  ? DVT (deep venous thrombosis) ([place]) 12/17/2008  ? Pulmonary embolism ([place])   ? ?Past Surgical History:  ?Procedure Laterality Date  ? ABDOMINAL HYSTERECTOMY  09/03/2020  ? CESAREAN SECTION  04/18/2002  ? TONSILLECTOMY    ? ?Patient Active Problem List  ? Diagnosis Date Noted  ? Right carpal tunnel syndrome 06/30/2021  ? 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 ? ?ONSET DATE: 06/04/21 ? ?REFERRING DIAG: R42 (ICD-10-CM) - Vertigo ?M54.2 (ICD-10-CM) - Cervicalgia ? ?THERAPY DIAG:  ?Cervicalgia ? ?Dizziness and giddiness ? ?New daily persistent headache ? ?PERTINENT HISTORY: depression, DVT ? ?PRECAUTIONS: None ? ?SUBJECTIVE: Patient reports that the headaches have gotten significantly better. Intensity has improved. Neck pain is feeling better, but is having some stiffness. Reports the neck feels loosened, but feel it at the base of the neck.  ? ?PAIN:  ?Are you having pain? Yes: NPRS scale: 5/10 ?Pain location: Posterior Head ?Pain description: Stiffness ? ? ?OBJECTIVE:  ? ?PT TREATMENT: ? ?Manual Therapy:  ?Suboccipital Release Bilaterally, 3 x 30 seconds, mild increase in HA noted at end of session with activities. PT educating on anatomical position of muscles and potential to elicit HA. PT  educating on how to properly use a tennis ball to provide self suboccipital release at home. Patient verbalize understanding. Handout Provided.  ?Completed cervical traction with pressure at suboccipital region, completed 4 x 25-30 seconds with pressure to patient's tolerance for improvements in pain and neck tension.   ? ? ?TherEx:  ?With patient in supine: completed cervical retraction x 10 reps with 5 second isometric hold. Added to HEP. Educated on proper completion. ?  ?Updated HEP to Include Bolded Items:  ?Access Code: [email or web address] ?URL: [email or web address] ?Date: 07/21/2021 ?Prepared by: Baldomero Lamy ? ?Exercises ?- Seated Levator Scapulae Stretch  - 2 x daily - 5 x weekly - 1 sets - 2 reps - 30 seconds hold ?- Seated Assisted Cervical Rotation with Towel  - 1 x daily - 5 x weekly - 1 sets - 5 reps - 10-15 seconds hold ?- Seated Scapular Retraction  - 1 x daily - 5 x weekly - 1 sets - 10 reps - 3 seconds hold ?- Supine Chin Tuck  - 1 x daily - 5 x weekly - 1 sets - 10 reps - 3-5 second  hold ?- Supine Suboccipital Release with Tennis Balls  - 1 x daily - 5 x weekly - 1 sets - 1 reps - 60 seconds hold ? ?TherAct:  ?Completed bending to cone to simulate getting item in/out of dryer, no increase in HA/dizziness with completion of 5 reps.  ?  ?PATIENT  EDUCATION: ?Education details: Updated HEP ?Person educated: Patient ?Education method: Explanation ?Education comprehension: verbalized understanding ?  ?ASSESSMENT: ?  ?CLINICAL IMPRESSION:  Continued manual therapy and therex focusing on targeting upper cervical spine and suboccipital region. Updated HEP. Patient able to meet LTG #4 today demonstrating improved tolerance and reduced dizziness. Plan to finish assessing progress toward LTGs at next session.  ?  ?OBJECTIVE IMPAIRMENTS decreased activity tolerance, decreased ROM, dizziness, and pain.  ?  ?ACTIVITY LIMITATIONS cleaning, meal prep, occupation, laundry, and shopping.  ?  ?PERSONAL FACTORS  Past/current experiences, Profession, Time since onset of injury/illness/exacerbation, and 1-2 comorbidities: HA x 6 months, DVT, PE, depression, HTN, HLD  are also affecting patient's functional outcome.  ?  ?REHAB POTENTIAL: Good ?  ?CLINICAL DECISION MAKING: Stable/uncomplicated ?  ?EVALUATION COMPLEXITY: Low ?  ?  ?GOALS: ?Goals reviewed with patient? Yes ?  ?LONG TERM GOALS: ?  ?Pt will report improvement in FOTO DPS to >/= 62 and DFS to >/= 58 ?Baseline: DPS: 53, DFS: 53.1 ?Target date: 07/30/2021 ?Goal status: INITIAL ?  ?2.  Pt will demonstrate independence with neck and vestibular home exercise program ?Baseline: no HEP to date ?Target date: 07/30/2021 ?Goal status: INITIAL ?  ?3.  Pt will demonstrate 10 degree improvement in pain free neck ROM to improve ability to return to performing household chores ?Baseline:  ?40 flexion  ?30 extension  ?30 pain R side (lat flexion R)  ?40 (lat flexion L)  ?60 pain R side (rotation R)  ?60 (rotation L)  ?  ?Target date: 07/30/2021 ?Goal status: INITIAL ?  ?4.  Pt will report no dizziness or increased HA when performing reaching down to the ground or reaching into low fridge, dryer or under bed ?Baseline: dizziness and pressure in head when performing; no dizziness or HA with reaching down to the ground ?Target date: 07/30/2021 ?Goal status: MET ?  ?PLAN: ?PT FREQUENCY: 1x/week ?  ?PT DURATION: 6 weeks ?  ?PLANNED INTERVENTIONS: Therapeutic exercises, Therapeutic activity, Neuromuscular re-education, Patient/Family education, Joint mobilization, Vestibular training, Canalith repositioning, Dry Needling, Spinal mobilization, Cryotherapy, Moist heat, Taping, and Manual therapy ?  ?PLAN FOR NEXT SESSION: Finish checking goals + plan to d/c next visit.  ? ? ?Jones Bales, PT, DPT ?07/21/21    11:49 AM ? ? ? ? ?   ? ?

## 2021-07-23 ENCOUNTER — Ambulatory Visit: Payer: Medicaid Other | Admitting: Family

## 2021-07-30 ENCOUNTER — Ambulatory Visit: Payer: Medicaid Other

## 2021-07-30 DIAGNOSIS — M542 Cervicalgia: Secondary | ICD-10-CM | POA: Diagnosis not present

## 2021-07-30 DIAGNOSIS — R42 Dizziness and giddiness: Secondary | ICD-10-CM

## 2021-07-30 DIAGNOSIS — G4452 New daily persistent headache (NDPH): Secondary | ICD-10-CM

## 2021-07-30 NOTE — Therapy (Signed)
?OUTPATIENT PHYSICAL THERAPY VESTIBULAR TREATMENT NOTE/DISCHARGE SUMMARY ? ? ?Patient Name: Jody Sandoval ?MRN: [id number] ?DOB:23-Feb-1981, 41 y.o., female ?Today's Date: 07/30/2021 ? ?PCP: Marrian Salvage, [place] ?REFERRING PROVIDER: Genia Harold, MD ? ?PHYSICAL THERAPY DISCHARGE SUMMARY ? ?Visits from Start of Care: 6 ? ?Current functional level related to goals / functional outcomes: ?See Clinical Impression Statement ?  ?Remaining deficits: ?Mild Neck Pain ?  ?Education / Equipment: ?HEP provided  ? ?Patient agrees to discharge. Patient goals were met. Patient is being discharged due to meeting the stated rehab goals. ? ? ? PT End of Session - 07/30/21 0934   ? ? Visit Number 6   ? Number of Visits 7   ? Date for PT Re-Evaluation 08/06/21   ? Authorization Type Medicaid   ? Authorization - Visit Number 5   ? Authorization - Number of Visits 6   ? PT Start Time [phone]   pt arriving late  ? PT Stop Time 1005   ? PT Time Calculation (min) 31 min   ? Activity Tolerance Patient tolerated treatment well   ? Behavior During Therapy [facility] for tasks assessed/performed   ? ?  ?  ? ?  ? ? ?Past Medical History:  ?Diagnosis Date  ? DVT (deep venous thrombosis) ([place]) 12/17/2008  ? Pulmonary embolism ([place])   ? ?Past Surgical History:  ?Procedure Laterality Date  ? ABDOMINAL HYSTERECTOMY  09/03/2020  ? CESAREAN SECTION  04/18/2002  ? TONSILLECTOMY    ? ?Patient Active Problem List  ? Diagnosis Date Noted  ? Right carpal tunnel syndrome 06/30/2021  ? 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 ? ?ONSET DATE: 06/04/21 ? ?REFERRING DIAG: R42 (ICD-10-CM) - Vertigo ?M54.2 (ICD-10-CM) - Cervicalgia ? ?THERAPY DIAG:  ?Cervicalgia ? ?Dizziness and giddiness ? ?New daily persistent headache ? ?PERTINENT HISTORY: depression, DVT ? ?PRECAUTIONS: None ? ?SUBJECTIVE: Patient reports that had a headache after last session. That was the only day she had a headache. No other new changes.  ? ?PAIN:  ?Are you having pain? Yes NPRS scale: reports  just general stiffness; no pain. ?Pain location: Neck (Left) ?Pain description: Stiffness ? ? ?OBJECTIVE:  ? ?PT TREATMENT: ? Cervical ROM:   07/30/21 ?40 flexion 48 Flexion  ?30 extension 36 Extension  ?30 pain R side (lat flexion R) 39 Lateral Flexion  ?40 (lat flexion L) 45 Lateral Flexion, Mild Pain  ?60 pain R side (rotation R) 60 Rotation, Mild Pain  ?60 (rotation L) 62 Rotation  ? ? ? FOTO:  ? DPS: 66%, DFS: 65.3% ? ?TherEx:  ?Completed standing scapular retraction with red therabands x 10 reps with cues for technique.  ? ?Reviewed and Updated HEP to Include Bolded Items:  ? ?Access Code: [email or web address] ?URL: [email or web address] ?Date: 07/30/2021 ?Prepared by: Baldomero Lamy ? ?Exercises ?- Seated Levator Scapulae Stretch  - 2 x daily - 5 x weekly - 1 sets 2 reps - 30 seconds hold ?- Seated Assisted Cervical Rotation with Towel  - 1 x daily - 5 x weekly - 1 sets - 5 reps - 10-15 seconds hold ?- Supine Chin Tuck  - 1 x daily - 5 x weekly - 1 sets - 10 reps - 3-5 second  hold ?- Supine Suboccipital Release with Tennis Balls  - 1 x daily - 5 x weekly - 1 sets - 1 reps - 60 seconds hold ?- Scapular Retraction with Resistance  - 1 x daily - 5 x weekly - 1 sets - 15 reps - 3 seconds  hold ? ? ?  ?PATIENT EDUCATION: ?Education details: Updated HEP; D/C, Progress toward LTGs ?Person educated: Patient ?Education method: Explanation ?Education comprehension: verbalized understanding ?  ?ASSESSMENT: ?  ?CLINICAL IMPRESSION:  Completed assessment of patient's progress toward LTGs. Patient able to meet or partially meet all LTGs. Patient demonstrating improved Cervical AROM. Patient has had significant improvement in dizziness, with none reported with functional activities. Patient continues to have increased tension in neck but reports ability to self manage with HEP and participation in massage therapy outside of PT services. Patient agreeable to d/c.  ?  ?OBJECTIVE IMPAIRMENTS decreased activity tolerance,  decreased ROM, dizziness, and pain.  ?  ?ACTIVITY LIMITATIONS cleaning, meal prep, occupation, laundry, and shopping.  ?  ?PERSONAL FACTORS Past/current experiences, Profession, Time since onset of injury/illness/exacerbation, and 1-2 comorbidities: HA x 6 months, DVT, PE, depression, HTN, HLD  are also affecting patient's functional outcome.  ?  ?REHAB POTENTIAL: Good ?  ?CLINICAL DECISION MAKING: Stable/uncomplicated ?  ?EVALUATION COMPLEXITY: Low ?  ?  ?GOALS: ?Goals reviewed with patient? Yes ?  ?LONG TERM GOALS: ?  ?Pt will report improvement in FOTO DPS to >/= 62 and DFS to >/= 58 ?Baseline: DPS: 53, DFS: 53.1; DPS: 66%, DFS: 65.3 ?Target date: 07/30/2021 ?Goal status: MET ?  ?2.  Pt will demonstrate independence with neck and vestibular home exercise program ?Baseline: no HEP to date; Report independence with HEP ?Target date: 07/30/2021 ?Goal status: MET ?  ?3.  Pt will demonstrate 10 degree improvement in pain free neck ROM to improve ability to return to performing household chores ?Baseline:  ?40 flexion 48 Flexion  ?30 extension 36 Extension  ?30 pain R side (lat flexion R) 39 Lateral Flexion  ?40 (lat flexion L) 45 Lateral Flexion, Mild Pain  ?60 pain R side (rotation R) 60 Rotation, Mild Pain  ?60 (rotation L) 62 Rotation  ?  ?Target date: 07/30/2021 ?Goal status: PARTIALLY MET ?  ?4.  Pt will report no dizziness or increased HA when performing reaching down to the ground or reaching into low fridge, dryer or under bed ?Baseline: dizziness and pressure in head when performing; no dizziness or HA with reaching down to the ground ?Target date: 07/30/2021 ?Goal status: MET ?  ?PLAN: ?PT FREQUENCY: 1x/week ?  ?PT DURATION: 6 weeks ?  ?PLANNED INTERVENTIONS: Therapeutic exercises, Therapeutic activity, Neuromuscular re-education, Patient/Family education, Joint mobilization, Vestibular training, Canalith repositioning, Dry Needling, Spinal mobilization, Cryotherapy, Moist heat, Taping, and Manual therapy ?   ?PLAN FOR NEXT SESSION: d/c this visit ? ? ?Jones Bales, PT, DPT ?07/30/21    10:09 AM ? ? ? ? ?   ? ?

## 2021-08-02 DIAGNOSIS — F321 Major depressive disorder, single episode, moderate: Secondary | ICD-10-CM | POA: Diagnosis not present

## 2021-08-02 DIAGNOSIS — F410 Panic disorder [episodic paroxysmal anxiety] without agoraphobia: Secondary | ICD-10-CM | POA: Diagnosis not present

## 2021-08-02 DIAGNOSIS — F411 Generalized anxiety disorder: Secondary | ICD-10-CM | POA: Diagnosis not present

## 2021-08-03 NOTE — Progress Notes (Deleted)
[facility] [address] [place] [address] Phone: [phone] Subjective:    I'm seeing this patient by the request  of:  Olive Bass, FNP  CC:   [id number]  06/30/2021 Patient signs and symptoms is more consistent with carpal tunnel syndrome.  Discussed with patient about icing regimen and home exercises.  Discussed with patient about bracing.  I also started on gabapentin.  Warned of any potential side effects.  Hopefully patient will respond well to this.  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  Follow-up with me again in 4 to 6 weeks.  Worsening pain consider occupational therapy and injections and differential and does include cervical radiculopathy but less likelihood.  Update 08/04/2021 CANNON QUINTON is a 41 y.o. female coming in with complaint of R elbow pain. Patient state s      Past Medical History:  Diagnosis Date   DVT (deep venous thrombosis) ([facility]) 12/17/2008   Pulmonary embolism [facility])    Past Surgical History:  Procedure Laterality Date   ABDOMINAL HYSTERECTOMY  09/03/2020   CESAREAN SECTION  04/18/2002   TONSILLECTOMY     Social History   Socioeconomic History   Marital status: Significant Other    Spouse name: Not on file   Number of children: 3   Years of education: college   Highest education level: Bachelor's degree (e.g., BA, AB, BS)  Occupational History   Occupation: helps prenatal mothers w/ resources  Tobacco Use   Smoking status: Never   Smokeless tobacco: Not on file  Substance and Sexual Activity   Alcohol use: No   Drug use: No   Sexual activity: Yes    Birth control/protection: Other-see comments  Other Topics Concern   Not on file  Social History Narrative   Lives at home children and their father.   Right-handed.    Caffeine use: 95 mg per day (energy shot).   Social Determinants of Health   Financial Resource Strain: Not on file  Food Insecurity: Not on file  Transportation Needs: Not on file  Physical Activity: Not on file  Stress: Not on file  Social Connections: Not on file   Allergies  Allergen Reactions   Nifedipine Swelling   Lisinopril Other (See Comments)    Cough Cough    Tape Itching    Blue tape   Ferric Carboxymaltose Itching   Family History  Problem Relation Age of Onset   Hypertension Mother    Diabetes Mother    Hypertension Father    Heart attack Father      Current Outpatient Medications (Cardiovascular):    hydrochlorothiazide (HYDRODIURIL) 12.5 MG tablet, Take 1 tablet (12.5 mg total) by mouth daily.   propranolol (INDERAL) 10 MG tablet, Take 10 mg by mouth 2 (two) times daily as needed.   rosuvastatin (CRESTOR) 5 MG tablet, Take 1 tablet (5 mg total) by mouth daily.   valsartan (DIOVAN) 160 MG tablet, Take 1 tablet (160 mg total) by mouth daily.    Current Outpatient Medications (Hematological):    ELIQUIS 5 MG TABS tablet, Take 1 tablet (5 mg total) by mouth 2 (two) times daily.  Current Outpatient Medications (Other):    escitalopram (LEXAPRO) 20 MG tablet, Take 20 mg by mouth daily.   potassium chloride (MICRO-K) 10 MEQ CR capsule, TAKE 1  CAPSULE(10 MEQ) BY MOUTH DAILY   Reviewed prior external information including notes and imaging from  primary care provider As well as notes that were available from care everywhere and other healthcare systems.  Past medical history, social, surgical and family history all reviewed in electronic medical record.  No pertanent information unless stated regarding to the chief complaint.   Review of Systems:  No headache, visual changes, nausea, vomiting, diarrhea, constipation, dizziness, abdominal pain, skin rash, fevers, chills, night sweats, weight loss, swollen lymph nodes, body aches, joint swelling, chest  pain, shortness of breath, mood changes. POSITIVE muscle aches  Objective  Last menstrual period 01/24/2011, unknown if currently breastfeeding.   General: No apparent distress alert and oriented x3 mood and affect normal, dressed appropriately.  HEENT: Pupils equal, extraocular movements intact  Respiratory: Patient's speak in full sentences and does not appear short of breath  Cardiovascular: No lower extremity edema, non tender, no erythema  Gait normal with good balance and coordination.  MSK:  Non tender with full range of motion and good stability and symmetric strength and tone of shoulders, elbows, wrist, hip, knee and ankles bilaterally.     Impression and Recommendations:     The above documentation has been reviewed and is accurate and complete Wilford Grist

## 2021-08-04 ENCOUNTER — Ambulatory Visit: Payer: Medicaid Other | Admitting: Family Medicine

## 2021-08-05 ENCOUNTER — Encounter: Payer: Self-pay | Admitting: *Deleted

## 2021-08-06 ENCOUNTER — Ambulatory Visit: Payer: Medicaid Other | Admitting: Family

## 2021-08-06 ENCOUNTER — Ambulatory Visit: Payer: Medicaid Other

## 2021-08-13 ENCOUNTER — Ambulatory Visit: Payer: Medicaid Other | Admitting: Family

## 2021-08-17 ENCOUNTER — Encounter: Payer: Self-pay | Admitting: Family

## 2021-08-20 ENCOUNTER — Ambulatory Visit: Payer: Medicaid Other | Admitting: Family

## 2021-08-20 ENCOUNTER — Encounter: Payer: Self-pay | Admitting: Family

## 2021-08-20 VITALS — BP 112/80 | HR 74 | Temp 98.0°F | Ht 67.0 in | Wt 263.6 lb

## 2021-08-20 DIAGNOSIS — I10 Essential (primary) hypertension: Secondary | ICD-10-CM

## 2021-08-20 DIAGNOSIS — R7309 Other abnormal glucose: Secondary | ICD-10-CM | POA: Diagnosis not present

## 2021-08-20 DIAGNOSIS — L989 Disorder of the skin and subcutaneous tissue, unspecified: Secondary | ICD-10-CM | POA: Diagnosis not present

## 2021-08-20 DIAGNOSIS — E785 Hyperlipidemia, unspecified: Secondary | ICD-10-CM

## 2021-08-20 DIAGNOSIS — Z800 Family history of malignant neoplasm of digestive organs: Secondary | ICD-10-CM

## 2021-08-20 LAB — COMPREHENSIVE METABOLIC PANEL
ALT: 22 U/L (ref 0–35)
AST: 18 U/L (ref 0–37)
Albumin: 4.3 g/dL (ref 3.5–5.2)
Alkaline Phosphatase: 58 U/L (ref 39–117)
BUN: 13 mg/dL (ref 6–23)
CO2: 30 mEq/L (ref 19–32)
Calcium: 9.1 mg/dL (ref 8.4–10.5)
Chloride: 103 mEq/L (ref 96–112)
Creatinine, Ser: 0.82 mg/dL (ref 0.40–1.20)
GFR: 87.67 mL/min (ref >60.00)
Glucose, Bld: 114 mg/dL — ABNORMAL HIGH (ref 70–99)
Potassium: 3.8 mEq/L (ref 3.5–5.1)
Sodium: 139 mEq/L (ref 135–145)
Total Bilirubin: 0.5 mg/dL (ref 0.2–1.2)
Total Protein: 6.8 g/dL (ref 6.0–8.3)

## 2021-08-20 LAB — CBC WITH DIFFERENTIAL/PLATELET
Basophils Absolute: 0 10*3/uL (ref 0.0–0.1)
Basophils Relative: 0.8 % (ref 0.0–3.0)
Eosinophils Absolute: 0.2 10*3/uL (ref 0.0–0.7)
Eosinophils Relative: 4.8 % (ref 0.0–5.0)
HCT: 39.6 % (ref 36.0–46.0)
Hemoglobin: 12.9 g/dL (ref 12.0–15.0)
Lymphocytes Relative: 25.5 % (ref 12.0–46.0)
Lymphs Abs: 0.9 10*3/uL (ref 0.7–4.0)
MCHC: 32.6 g/dL (ref 30.0–36.0)
MCV: 93.9 fl (ref 78.0–100.0)
Monocytes Absolute: 0.3 10*3/uL (ref 0.1–1.0)
Monocytes Relative: 7.9 % (ref 3.0–12.0)
Neutro Abs: 2.2 10*3/uL (ref 1.4–7.7)
Neutrophils Relative %: 61 % (ref 43.0–77.0)
Platelets: 184 10*3/uL (ref 150.0–400.0)
RBC: 4.22 Mil/uL (ref 3.87–5.11)
RDW: 13.7 % (ref 11.5–15.5)
WBC: 3.6 10*3/uL — ABNORMAL LOW (ref 4.0–10.5)

## 2021-08-20 LAB — HEMOGLOBIN A1C: Hgb A1c MFr Bld: 6.3 % (ref 4.6–6.5)

## 2021-08-20 MED ORDER — ROSUVASTATIN CALCIUM 5 MG PO TABS: 5.0000 mg | ORAL_TABLET | Freq: Every day | ORAL | 3 refills | Status: DC

## 2021-08-20 MED ORDER — PROPRANOLOL HCL 10 MG PO TABS: 10.0000 mg | ORAL_TABLET | Freq: Every day | ORAL | 3 refills | Status: DC

## 2021-08-20 MED ORDER — VALSARTAN 160 MG PO TABS: 160.0000 mg | ORAL_TABLET | Freq: Every day | ORAL | 3 refills | Status: DC

## 2021-08-20 NOTE — Progress Notes (Signed)
?Jody Sandoval is a 41 y.o. female with the following history as recorded in EpicCare:  ?Patient Active Problem List  ? Diagnosis Date Noted  ? FH: colon cancer 08/20/2021  ? Hyperlipidemia 08/20/2021  ? Right carpal tunnel syndrome 06/30/2021  ? Panic disorder 03/06/2019  ? Depression 11/21/2016  ? Bilateral pulmonary embolism ([place]) 05/09/2016  ? Essential hypertension 01/18/2013  ? 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  ?Current Outpatient Medications  ?Medication Sig Dispense Refill  ? ELIQUIS 5 MG TABS tablet Take 1 tablet (5 mg total) by mouth 2 (two) times daily. 60 tablet 11  ? escitalopram (LEXAPRO) 20 MG tablet Take 20 mg by mouth daily.    ? hydrochlorothiazide (HYDRODIURIL) 12.5 MG tablet Take 1 tablet (12.5 mg total) by mouth daily. 90 tablet 1  ? potassium chloride (MICRO-K) 10 MEQ CR capsule TAKE 1 CAPSULE(10 MEQ) BY MOUTH DAILY 90 capsule 1  ? propranolol (INDERAL) 10 MG tablet Take 1 tablet (10 mg total) by mouth daily. 90 tablet 3  ? rosuvastatin (CRESTOR) 5 MG tablet Take 1 tablet (5 mg total) by mouth daily. 90 tablet 3  ? valsartan (DIOVAN) 160 MG tablet Take 1 tablet (160 mg total) by mouth daily. 90 tablet 3  ? ?No current facility-administered medications for this visit.  ?  ?Allergies: Nifedipine, Lisinopril, Tape, and Ferric carboxymaltose  ?Past Medical History:  ?Diagnosis Date  ? DVT (deep venous thrombosis) ([place]) 12/17/2008  ? Pulmonary embolism ([place])   ?  ?Past Surgical History:  ?Procedure Laterality Date  ? ABDOMINAL HYSTERECTOMY  09/03/2020  ? CESAREAN SECTION  04/18/2002  ? TONSILLECTOMY    ?  ?Family History  ?Problem Relation Age of Onset  ? Hypertension Mother   ? Diabetes Mother   ? Hypertension Father   ? Heart attack Father   ?  ?Social History  ? ?Tobacco Use  ? Smoking status: Never  ? Smokeless tobacco: Not on file  ?Substance Use Topics  ? Alcohol use: No  ?  ?Subjective:  ? ?3 month follow up for chronic care needs; needs refills updated and labs updated;  ?Overdue to  see GYN- planning to schedule; wants to make her own appointment;  ?Colonoscopy is scheduled for 5/17 through [facility];  ?Has lost 5 pounds since last OV- "committed to taking care of myself." ? ?Needs dermatology referral for facial lesion;  ? ? ?Objective:  ?Vitals:  ? 08/20/21 0844  ?BP: 112/80  ?Pulse: 74  ?Temp: 98 ?F (36.7 ?C)  ?TempSrc: Oral  ?SpO2: 97%  ?Weight: 263 lb 9.6 oz (119.6 kg)  ?Height: 5' 7"  (1.702 m)  ?  ?General: Well developed, well nourished, in no acute distress  ?Skin : Warm and dry.  ?Head: Normocephalic and atraumatic  ?Eyes: Sclera and conjunctiva clear; pupils round and reactive to light; extraocular movements intact  ?Ears: External normal; canals clear; tympanic membranes normal  ?Oropharynx: Pink, supple. No suspicious lesions  ?Neck: Supple without thyromegaly, adenopathy  ?Lungs: Respirations unlabored; clear to auscultation bilaterally without wheeze, rales, rhonchi  ?CVS exam: normal rate and regular rhythm.  ?Neurologic: Alert and oriented; speech intact; face symmetrical; moves all extremities well; CNII-XII intact without focal deficit  ? ?Assessment:  ?1. Elevated glucose   ?2. FH: colon cancer   ?3. Facial lesion   ?4. Hyperlipidemia, unspecified hyperlipidemia type   ?5. Primary hypertension   ?  ?Plan:  ?Check Hgba1c today;  ?Patient has colonoscopy scheduled later this month; will get copies of results;  ?Referral  to dermatology;  ?Check lipid panel; refill updated; ?Stable; refill updated;  ? ?Patient will make her own GYN appointment; will continue with her specialists through [facility];  ? ? ? ?No follow-ups on file.  ?Orders Placed This Encounter  ?Procedures  ? CBC with Differential/Platelet  ? Comp Met (CMET)  ? Hemoglobin A1c  ? Lipid panel  ? Ambulatory referral to Dermatology  ?  Referral Priority:   Routine  ?  Referral Type:   Consultation  ?  Referral Reason:   Specialty Services Required  ?  Requested Specialty:   Dermatology  ?  Number of Visits  Requested:   1  ?  ?Requested Prescriptions  ? ?Signed Prescriptions Disp Refills  ? propranolol (INDERAL) 10 MG tablet 90 tablet 3  ?  Sig: Take 1 tablet (10 mg total) by mouth daily.  ? rosuvastatin (CRESTOR) 5 MG tablet 90 tablet 3  ?  Sig: Take 1 tablet (5 mg total) by mouth daily.  ? valsartan (DIOVAN) 160 MG tablet 90 tablet 3  ?  Sig: Take 1 tablet (160 mg total) by mouth daily.  ?  ? ?

## 2021-08-23 ENCOUNTER — Other Ambulatory Visit (HOSPITAL_BASED_OUTPATIENT_CLINIC_OR_DEPARTMENT_OTHER): Payer: Self-pay

## 2021-09-01 DIAGNOSIS — D122 Benign neoplasm of ascending colon: Secondary | ICD-10-CM | POA: Diagnosis not present

## 2021-09-01 DIAGNOSIS — Z1211 Encounter for screening for malignant neoplasm of colon: Secondary | ICD-10-CM | POA: Diagnosis not present

## 2021-09-01 DIAGNOSIS — K635 Polyp of colon: Secondary | ICD-10-CM | POA: Diagnosis not present

## 2021-09-01 DIAGNOSIS — K5730 Diverticulosis of large intestine without perforation or abscess without bleeding: Secondary | ICD-10-CM | POA: Diagnosis not present

## 2021-09-01 DIAGNOSIS — K625 Hemorrhage of anus and rectum: Secondary | ICD-10-CM | POA: Diagnosis not present

## 2021-09-01 DIAGNOSIS — K648 Other hemorrhoids: Secondary | ICD-10-CM | POA: Diagnosis not present

## 2021-09-01 DIAGNOSIS — Z800 Family history of malignant neoplasm of digestive organs: Secondary | ICD-10-CM | POA: Diagnosis not present

## 2021-09-01 LAB — HM COLONOSCOPY

## 2021-09-02 DIAGNOSIS — F321 Major depressive disorder, single episode, moderate: Secondary | ICD-10-CM | POA: Diagnosis not present

## 2021-09-02 DIAGNOSIS — Z79899 Other long term (current) drug therapy: Secondary | ICD-10-CM | POA: Diagnosis not present

## 2021-09-02 DIAGNOSIS — F411 Generalized anxiety disorder: Secondary | ICD-10-CM | POA: Diagnosis not present

## 2021-09-02 DIAGNOSIS — F410 Panic disorder [episodic paroxysmal anxiety] without agoraphobia: Secondary | ICD-10-CM | POA: Diagnosis not present

## 2021-09-20 ENCOUNTER — Ambulatory Visit: Payer: Medicaid Other | Admitting: Family Medicine

## 2021-09-20 ENCOUNTER — Encounter: Payer: Self-pay | Admitting: Family Medicine

## 2021-09-20 VITALS — BP 122/81 | HR 68 | Ht 67.0 in | Wt 260.6 lb

## 2021-09-20 DIAGNOSIS — N3001 Acute cystitis with hematuria: Secondary | ICD-10-CM | POA: Diagnosis not present

## 2021-09-20 DIAGNOSIS — R3915 Urgency of urination: Secondary | ICD-10-CM | POA: Diagnosis not present

## 2021-09-20 LAB — POC URINALSYSI DIPSTICK (AUTOMATED)
Bilirubin, UA: NEGATIVE
Glucose, UA: NEGATIVE
Ketones, UA: NEGATIVE
Nitrite, UA: NEGATIVE
Protein, UA: POSITIVE — AB
Spec Grav, UA: 1.015 (ref 1.010–1.025)
Urobilinogen, UA: 0.2 E.U./dL
pH, UA: 7 (ref 5.0–8.0)

## 2021-09-20 MED ORDER — SULFAMETHOXAZOLE-TRIMETHOPRIM 800-160 MG PO TABS: 1.0000 | ORAL_TABLET | Freq: Two times a day (BID) | ORAL | 0 refills | Status: AC

## 2021-09-20 NOTE — Patient Instructions (Signed)
Urinalysis is suggestive of a UTI. I will go ahead and give you some Bactrim since this has been going on for awhile. The send-off culture will take a few days to come back - if we have to make any changes to the plan based on that, we will notify you. In the meantime, you can start the Bactrim, stay well hydrated, and continue with good hygiene.   Increase fluid intake to a minimum of 8, 8 ounce glasses of water per day to help flush the kidneys and bladder.   If you begin to have worsening symptoms, low back pain, fevers, chills, nausea, vomiting, diarrhea, or decreased urine, please let us know.

## 2021-09-20 NOTE — Progress Notes (Signed)
Acute Office Visit  Subjective:     Patient ID: Jody Sandoval, female    DOB: 09/28/1980, 41 y.o.   MRN: [id number]  CC: UTI symptoms    Urinary Tract Infection  This is a new problem. The current episode started in the past 7 days (started 1 week ago). The problem occurs intermittently. The problem has been waxing and waning. The pain is at a severity of 0/10. The patient is experiencing no pain. There has been no fever. She is Sexually active. There is No history of pyelonephritis. Associated symptoms include frequency, hesitancy and urgency. Pertinent negatives include no chills, discharge, flank pain, hematuria, nausea, possible pregnancy, sweats or vomiting. Associated symptoms comments: Cloudy urine, no unusual odor. She has tried nothing for the symptoms. There is no history of recurrent UTIs.      Review of Systems  All review of systems negative except what is listed in the HPI      Objective:    BP 122/81   Pulse 68   Ht 5\' 7"  (1.702 m)   Wt 260 lb 9.6 oz (118.2 kg)   LMP 01/24/2011   BMI 40.82 kg/m    Physical Exam Vitals reviewed.  Constitutional:      Appearance: Normal appearance.  HENT:     Head: Normocephalic and atraumatic.  Cardiovascular:     Rate and Rhythm: Normal rate and regular rhythm.  Pulmonary:     Effort: Pulmonary effort is normal.     Breath sounds: Normal breath sounds.  Abdominal:     General: Abdomen is flat. Bowel sounds are normal.     Palpations: Abdomen is soft. There is no mass.     Tenderness: There is no abdominal tenderness. There is no right CVA tenderness, left CVA tenderness or guarding.  Genitourinary:    Comments: deferred Neurological:     General: No focal deficit present.     Mental Status: She is alert and oriented to person, place, and time. Mental status is at baseline.  Psychiatric:        Mood and Affect: Mood normal.        Behavior: Behavior normal.        Thought Content: Thought content normal.         Judgment: Judgment normal.       Results for orders placed or performed in visit on 09/20/21  POCT Urinalysis Dipstick (Automated)  Result Value Ref Range   Color, UA yellow    Clarity, UA turbid    Glucose, UA Negative Negative   Bilirubin, UA neg    Ketones, UA neg    Spec Grav, UA 1.015 1.010 - 1.025   Blood, UA trace    pH, UA 7.0 5.0 - 8.0   Protein, UA Positive (A) Negative   Urobilinogen, UA 0.2 0.2 or 1.0 E.U./dL   Nitrite, UA neg    Leukocytes, UA Moderate (2+) (A) Negative        Assessment & Plan:   1. Urinary urgency - POCT Urinalysis Dipstick (Automated) - Urine Culture  2. Acute cystitis with hematuria Urinalysis is suggestive of a UTI. I will go ahead and give you some Bactrim since this has been going on for awhile. The send-off culture will take a few days to come back - if we have to make any changes to the plan based on that, we will notify you. In the meantime, you can start the Bactrim, stay well hydrated, and continue with good hygiene.  Increase fluid intake to a minimum of 8, 8 ounce glasses of water per day to help flush the kidneys and bladder.   If you begin to have worsening symptoms, low back pain, fevers, chills, nausea, vomiting, diarrhea, or decreased urine, please let us know.   - sulfamethoxazole-trimethoprim (BACTRIM DS) 800-160 MG tablet; Take 1 tablet by mouth 2 (two) times daily for 3 days.  Dispense: 6 tablet; Refill: 0    Return if symptoms worsen or fail to improve.  Clayborne Dana, NP

## 2021-09-21 DIAGNOSIS — F411 Generalized anxiety disorder: Secondary | ICD-10-CM | POA: Diagnosis not present

## 2021-09-21 DIAGNOSIS — F321 Major depressive disorder, single episode, moderate: Secondary | ICD-10-CM | POA: Diagnosis not present

## 2021-09-22 LAB — URINE CULTURE
MICRO NUMBER:: 13483340
SPECIMEN QUALITY:: ADEQUATE

## 2021-09-24 ENCOUNTER — Encounter: Payer: Self-pay | Admitting: Family

## 2021-09-24 ENCOUNTER — Encounter: Payer: Self-pay | Admitting: *Deleted

## 2021-09-28 DIAGNOSIS — Z9889 Other specified postprocedural states: Secondary | ICD-10-CM | POA: Diagnosis not present

## 2021-09-28 DIAGNOSIS — Z48815 Encounter for surgical aftercare following surgery on the digestive system: Secondary | ICD-10-CM | POA: Diagnosis not present

## 2021-09-28 DIAGNOSIS — Z8719 Personal history of other diseases of the digestive system: Secondary | ICD-10-CM | POA: Diagnosis not present

## 2021-09-28 DIAGNOSIS — Z5189 Encounter for other specified aftercare: Secondary | ICD-10-CM | POA: Diagnosis not present

## 2021-09-29 ENCOUNTER — Other Ambulatory Visit (HOSPITAL_BASED_OUTPATIENT_CLINIC_OR_DEPARTMENT_OTHER): Payer: Self-pay

## 2021-10-05 ENCOUNTER — Encounter: Payer: Self-pay | Admitting: Family Medicine

## 2021-10-07 ENCOUNTER — Ambulatory Visit: Payer: Medicaid Other | Admitting: Family Medicine

## 2021-10-07 VITALS — BP 110/80 | HR 73 | Temp 98.3°F | Resp 18 | Ht 67.0 in | Wt 260.6 lb

## 2021-10-07 DIAGNOSIS — R3915 Urgency of urination: Secondary | ICD-10-CM | POA: Diagnosis not present

## 2021-10-07 DIAGNOSIS — N3001 Acute cystitis with hematuria: Secondary | ICD-10-CM | POA: Diagnosis not present

## 2021-10-07 LAB — POCT URINALYSIS DIP (CLINITEK)
Bilirubin, UA: NEGATIVE
Glucose, UA: NEGATIVE mg/dL
Ketones, POC UA: NEGATIVE mg/dL
Leukocytes, UA: NEGATIVE
Nitrite, UA: NEGATIVE
POC PROTEIN,UA: NEGATIVE
Spec Grav, UA: >=1.03 — AB (ref 1.010–1.025)
Urobilinogen, UA: 0.2 E.U./dL
pH, UA: 5 (ref 5.0–8.0)

## 2021-10-07 NOTE — Progress Notes (Unsigned)
   Acute Office Visit  Subjective:     Patient ID: Jody Sandoval, female    DOB: [birth year], 41 y.o.   MRN: [id number]  Chief Complaint  Patient presents with   f/u on uti    Seen on 09/20/21 for UTI and was Prescribed Bactrim but she is having the sxs still.     HPI Patient is in today for urinary urgency. ***  Urine is still cloudy in the morning mostly; still having some urgency  No dysuria, no blood, n/v/d,  Admits to not drinking enough water  She has recently increased her caffeine intake       ROS All review of systems negative except what is listed in the HPI      Objective:    BP 110/80 (BP Location: Left Arm, Patient Position: Sitting, Cuff Size: Large)   Pulse 73   Temp 98.3 F (36.8 C) (Oral)   Resp 18   Ht 5\' 7"  (1.702 m)   Wt 260 lb 9.6 oz (118.2 kg)   LMP 01/24/2011   SpO2 97%   BMI 40.82 kg/m  {Vitals History (Optional):23777}  Physical Exam Vitals reviewed.  Constitutional:      Appearance: Normal appearance.  Abdominal:     General: There is no distension.     Tenderness: There is no abdominal tenderness. There is no right CVA tenderness or left CVA tenderness.  Skin:    General: Skin is warm and dry.  Neurological:     General: No focal deficit present.     Mental Status: She is alert and oriented to person, place, and time. Mental status is at baseline.  Psychiatric:        Mood and Affect: Mood normal.        Behavior: Behavior normal.        Thought Content: Thought content normal.        Judgment: Judgment normal.     Results for orders placed or performed in visit on 10/07/21  POCT URINALYSIS DIP (CLINITEK)  Result Value Ref Range   Color, UA yellow yellow   Clarity, UA cloudy (A) clear   Glucose, UA negative negative mg/dL   Bilirubin, UA negative negative   Ketones, POC UA negative negative mg/dL   Spec Grav, UA 10/09/21 (A) 1.010 - 1.025   Blood, UA small (A) negative   pH, UA 5.0 5.0 - 8.0   POC PROTEIN,UA negative  negative, trace   Urobilinogen, UA 0.2 0.2 or 1.0 E.U./dL   Nitrite, UA Negative Negative   Leukocytes, UA Negative Negative        Assessment & Plan:   1. Urinary urgency UA - negative for leuks/nitrites, small blood, no protein  Patient agreeable to waiting on culture given that dipstick appears improving and no pain. Recommend she increase water intake and decrease caffeine intake. Still showing small amount of blood - recommend repeat UA in 2 weeks or so to ensure resolution.   - Urine Culture - POCT URINALYSIS DIP (CLINITEK)    No orders of the defined types were placed in this encounter.   No follow-ups on file.  >=7.989, NP

## 2021-10-08 ENCOUNTER — Encounter: Payer: Self-pay | Admitting: Family Medicine

## 2021-10-08 DIAGNOSIS — F410 Panic disorder [episodic paroxysmal anxiety] without agoraphobia: Secondary | ICD-10-CM | POA: Diagnosis not present

## 2021-10-08 DIAGNOSIS — F321 Major depressive disorder, single episode, moderate: Secondary | ICD-10-CM | POA: Diagnosis not present

## 2021-10-08 DIAGNOSIS — F411 Generalized anxiety disorder: Secondary | ICD-10-CM | POA: Diagnosis not present

## 2021-10-10 LAB — URINE CULTURE
MICRO NUMBER:: 13559567
SPECIMEN QUALITY:: ADEQUATE

## 2021-10-11 ENCOUNTER — Other Ambulatory Visit (HOSPITAL_BASED_OUTPATIENT_CLINIC_OR_DEPARTMENT_OTHER): Payer: Self-pay

## 2021-10-11 MED ORDER — CEPHALEXIN 500 MG PO CAPS
500.0000 mg | ORAL_CAPSULE | Freq: Two times a day (BID) | ORAL | 0 refills | Status: DC
  Filled 2021-10-11: qty 14, 7d supply, fill #0

## 2021-10-13 ENCOUNTER — Telehealth: Payer: Self-pay | Admitting: Psychiatry

## 2021-10-13 NOTE — Telephone Encounter (Signed)
noted 

## 2021-10-13 NOTE — Telephone Encounter (Signed)
..   Pt understands that although there may be some limitations with this type of visit, we will take all precautions to reduce any security or privacy concerns.  Pt understands that this will be treated like an in office visit and we will file with pt's insurance, and there may be a patient responsible charge related to this service. ? ?

## 2021-10-14 ENCOUNTER — Telehealth: Payer: Medicaid Other | Admitting: Psychiatry

## 2021-10-20 ENCOUNTER — Other Ambulatory Visit (HOSPITAL_BASED_OUTPATIENT_CLINIC_OR_DEPARTMENT_OTHER): Payer: Self-pay

## 2021-10-20 ENCOUNTER — Other Ambulatory Visit: Payer: Self-pay | Admitting: Family

## 2021-10-20 MED ORDER — VALSARTAN 160 MG PO TABS
160.0000 mg | ORAL_TABLET | Freq: Every day | ORAL | 0 refills | Status: DC
  Filled 2021-10-20: qty 90, 90d supply, fill #0

## 2021-10-21 ENCOUNTER — Other Ambulatory Visit (HOSPITAL_BASED_OUTPATIENT_CLINIC_OR_DEPARTMENT_OTHER): Payer: Self-pay

## 2021-10-22 ENCOUNTER — Other Ambulatory Visit (HOSPITAL_BASED_OUTPATIENT_CLINIC_OR_DEPARTMENT_OTHER): Payer: Self-pay

## 2021-10-27 ENCOUNTER — Other Ambulatory Visit (HOSPITAL_BASED_OUTPATIENT_CLINIC_OR_DEPARTMENT_OTHER): Payer: Self-pay

## 2021-10-28 ENCOUNTER — Ambulatory Visit: Payer: Medicaid Other | Admitting: Family

## 2021-10-28 VITALS — BP 122/80 | HR 77 | Temp 98.3°F | Resp 16 | Ht 67.0 in | Wt 262.4 lb

## 2021-10-28 DIAGNOSIS — N390 Urinary tract infection, site not specified: Secondary | ICD-10-CM | POA: Diagnosis not present

## 2021-10-28 LAB — POC URINALSYSI DIPSTICK (AUTOMATED)
Bilirubin, UA: NEGATIVE
Blood, UA: NEGATIVE
Glucose, UA: NEGATIVE
Ketones, UA: NEGATIVE
Nitrite, UA: NEGATIVE
Protein, UA: NEGATIVE
Spec Grav, UA: 1.02 (ref 1.010–1.025)
Urobilinogen, UA: 0.2 E.U./dL
pH, UA: 5 (ref 5.0–8.0)

## 2021-10-28 MED ORDER — CIPROFLOXACIN HCL 500 MG PO TABS: 500.0000 mg | ORAL_TABLET | Freq: Two times a day (BID) | ORAL | 0 refills | Status: AC

## 2021-10-28 NOTE — Progress Notes (Signed)
Jody Sandoval is a 41 y.o. female with the following history as recorded in EpicCare:  Patient Active Problem List   Diagnosis Date Noted   FH: colon cancer 08/20/2021   Hyperlipidemia 08/20/2021   Right carpal tunnel syndrome 06/30/2021   Panic disorder 03/06/2019   Depression 11/21/2016   Bilateral pulmonary embolism ([facility]) 05/09/2016   Essential hypertension 01/18/2013   ANEMIA-IRON DEFICIENCY 12/18/2009    Current Outpatient Medications  Medication Sig Dispense Refill   cephALEXin (KEFLEX) 500 MG capsule Take 1 capsule (500 mg total) by mouth 2 (two) times daily. 14 capsule 0   ciprofloxacin (CIPRO) 500 MG tablet Take 1 tablet (500 mg total) by mouth 2 (two) times daily for 10 days. 20 tablet 0   ELIQUIS 5 MG TABS tablet Take 1 tablet (5 mg total) by mouth 2 (two) times daily. 60 tablet 11   escitalopram (LEXAPRO) 20 MG tablet Take 20 mg by mouth daily.     hydrochlorothiazide (HYDRODIURIL) 12.5 MG tablet Take 1 tablet (12.5 mg total) by mouth daily. 90 tablet 1   potassium chloride (MICRO-K) 10 MEQ CR capsule TAKE 1 CAPSULE(10 MEQ) BY MOUTH DAILY 90 capsule 1   propranolol (INDERAL) 10 MG tablet Take 1 tablet (10 mg total) by mouth daily. 90 tablet 3   rosuvastatin (CRESTOR) 5 MG tablet Take 1 tablet (5 mg total) by mouth daily. 90 tablet 3   valsartan (DIOVAN) 160 MG tablet Take 1 tablet (160 mg total) by mouth daily. 90 tablet 3   valsartan (DIOVAN) 160 MG tablet Take 1 tablet (160 mg total) by mouth daily. 90 tablet 0   No current facility-administered medications for this visit.    Allergies: Nifedipine, Lisinopril, Tape, and Ferric carboxymaltose  Past Medical History:  Diagnosis Date   DVT (deep venous thrombosis) ([facility]) 12/17/2008   Pulmonary embolism ([facility])     Past Surgical History:  Procedure Laterality Date   ABDOMINAL HYSTERECTOMY  09/03/2020   CESAREAN SECTION  04/18/2002   TONSILLECTOMY      Family History  Problem Relation Age of Onset   Hypertension  Mother    Diabetes Mother    Hypertension Father    Heart attack Father     Social History   Tobacco Use   Smoking status: Never   Smokeless tobacco: Not on file  Substance Use Topics   Alcohol use: No    Subjective:   Treated for UTI on 09/20/2021 with course of Bactrim; felt like symptoms returned and seen again on 6/22- treated with Keflex at that point but noticed very little benefit on Keflex; feels like symptoms have returned again; + burning, urgency, frequency; admits that has not been drinking enough water;     Objective:  Vitals:   10/28/21 1335  BP: 122/80  Pulse: 77  Resp: 16  Temp: 98.3 F (36.8 C)  TempSrc: Oral  SpO2: 94%  Weight: 262 lb 6.4 oz (119 kg)  Height: 5\' 7"  (1.702 m)    General: Well developed, well nourished, in no acute distress  Skin : Warm and dry.  Head: Normocephalic and atraumatic  Lungs: Respirations unlabored; clear to auscultation bilaterally without wheeze, rales, rhonchi  Neurologic: Alert and oriented; speech intact; face symmetrical; moves all extremities well; CNII-XII intact without focal deficit   Assessment:  1. Recurrent UTI     Plan:  Check U/A and urine culture; Rx for Cipro 500 mg bid x 10 days; follow up to be determined based on these culture results.  No follow-ups on file.  No orders of the defined types were placed in this encounter.   Requested Prescriptions   Signed Prescriptions Disp Refills   ciprofloxacin (CIPRO) 500 MG tablet 20 tablet 0    Sig: Take 1 tablet (500 mg total) by mouth 2 (two) times daily for 10 days.

## 2021-10-31 LAB — URINE CULTURE
MICRO NUMBER:: 13643058
SPECIMEN QUALITY:: ADEQUATE

## 2021-11-02 ENCOUNTER — Other Ambulatory Visit: Payer: Medicaid Other

## 2021-11-11 ENCOUNTER — Other Ambulatory Visit: Payer: Medicaid Other

## 2021-11-11 DIAGNOSIS — N390 Urinary tract infection, site not specified: Secondary | ICD-10-CM

## 2021-11-12 LAB — URINE CULTURE
MICRO NUMBER:: 13702689
Result:: NO GROWTH
SPECIMEN QUALITY:: ADEQUATE

## 2021-11-24 ENCOUNTER — Other Ambulatory Visit (HOSPITAL_BASED_OUTPATIENT_CLINIC_OR_DEPARTMENT_OTHER): Payer: Self-pay

## 2021-11-24 DIAGNOSIS — F411 Generalized anxiety disorder: Secondary | ICD-10-CM | POA: Diagnosis not present

## 2021-11-24 DIAGNOSIS — F410 Panic disorder [episodic paroxysmal anxiety] without agoraphobia: Secondary | ICD-10-CM | POA: Diagnosis not present

## 2021-11-24 DIAGNOSIS — F321 Major depressive disorder, single episode, moderate: Secondary | ICD-10-CM | POA: Diagnosis not present

## 2021-11-24 DIAGNOSIS — Z79899 Other long term (current) drug therapy: Secondary | ICD-10-CM | POA: Diagnosis not present

## 2021-12-27 ENCOUNTER — Other Ambulatory Visit: Payer: Self-pay | Admitting: Family

## 2021-12-28 ENCOUNTER — Other Ambulatory Visit (HOSPITAL_BASED_OUTPATIENT_CLINIC_OR_DEPARTMENT_OTHER): Payer: Self-pay

## 2021-12-28 MED ORDER — HYDROCHLOROTHIAZIDE 12.5 MG PO TABS
12.5000 mg | ORAL_TABLET | Freq: Every day | ORAL | 1 refills | Status: DC
  Filled 2021-12-28 – 2021-12-29 (×2): qty 90, 90d supply, fill #0

## 2021-12-29 ENCOUNTER — Other Ambulatory Visit (HOSPITAL_BASED_OUTPATIENT_CLINIC_OR_DEPARTMENT_OTHER): Payer: Self-pay

## 2022-01-12 ENCOUNTER — Other Ambulatory Visit: Payer: Self-pay | Admitting: Family

## 2022-01-12 ENCOUNTER — Other Ambulatory Visit (HOSPITAL_BASED_OUTPATIENT_CLINIC_OR_DEPARTMENT_OTHER): Payer: Self-pay

## 2022-01-12 MED ORDER — VALSARTAN 160 MG PO TABS
160.0000 mg | ORAL_TABLET | Freq: Every day | ORAL | 1 refills | Status: DC
  Filled 2022-01-12: qty 90, 90d supply, fill #0

## 2022-01-13 ENCOUNTER — Other Ambulatory Visit (HOSPITAL_BASED_OUTPATIENT_CLINIC_OR_DEPARTMENT_OTHER): Payer: Self-pay

## 2022-01-14 ENCOUNTER — Other Ambulatory Visit (HOSPITAL_BASED_OUTPATIENT_CLINIC_OR_DEPARTMENT_OTHER): Payer: Self-pay

## 2022-02-01 ENCOUNTER — Other Ambulatory Visit (HOSPITAL_BASED_OUTPATIENT_CLINIC_OR_DEPARTMENT_OTHER): Payer: Self-pay

## 2022-02-02 ENCOUNTER — Other Ambulatory Visit: Payer: Self-pay

## 2022-02-02 ENCOUNTER — Other Ambulatory Visit (HOSPITAL_BASED_OUTPATIENT_CLINIC_OR_DEPARTMENT_OTHER): Payer: Self-pay

## 2022-02-02 MED ORDER — ELIQUIS 5 MG PO TABS: 5.0000 mg | ORAL_TABLET | Freq: Two times a day (BID) | ORAL | 0 refills | Status: DC

## 2022-02-03 ENCOUNTER — Other Ambulatory Visit (HOSPITAL_BASED_OUTPATIENT_CLINIC_OR_DEPARTMENT_OTHER): Payer: Self-pay

## 2022-02-03 ENCOUNTER — Telehealth: Payer: Self-pay

## 2022-02-03 NOTE — Telephone Encounter (Signed)
PA initiated via Covermymeds; KEY: [id number]. Awaiting determination.

## 2022-02-03 NOTE — Telephone Encounter (Signed)
PA approved.   [id number];ASNKNL:[id number];Review Type:Prior Auth;Coverage Start Date:02/03/2022;Coverage End Date:02/03/2023;

## 2022-02-25 ENCOUNTER — Other Ambulatory Visit (HOSPITAL_BASED_OUTPATIENT_CLINIC_OR_DEPARTMENT_OTHER): Payer: Self-pay

## 2022-02-25 ENCOUNTER — Ambulatory Visit: Payer: Managed Care, Other (non HMO) | Admitting: Family

## 2022-02-25 ENCOUNTER — Ambulatory Visit: Payer: Medicaid Other | Admitting: Family

## 2022-02-25 ENCOUNTER — Encounter: Payer: Self-pay | Admitting: Family

## 2022-02-25 VITALS — BP 134/82 | HR 84 | Temp 98.4°F | Ht 67.0 in | Wt 265.8 lb

## 2022-02-25 DIAGNOSIS — R7309 Other abnormal glucose: Secondary | ICD-10-CM | POA: Diagnosis not present

## 2022-02-25 DIAGNOSIS — Z0000 Encounter for general adult medical examination without abnormal findings: Secondary | ICD-10-CM

## 2022-02-25 DIAGNOSIS — I10 Essential (primary) hypertension: Secondary | ICD-10-CM

## 2022-02-25 MED ORDER — VALSARTAN 160 MG PO TABS
160.0000 mg | ORAL_TABLET | Freq: Every day | ORAL | 3 refills | Status: DC
  Filled 2022-02-25 – 2022-04-20 (×2): qty 90, 90d supply, fill #0
  Filled 2022-07-15: qty 90, 90d supply, fill #1
  Filled 2022-10-04: qty 90, 90d supply, fill #2
  Filled 2023-01-11: qty 90, 90d supply, fill #3

## 2022-02-25 MED ORDER — POTASSIUM CHLORIDE ER 10 MEQ PO CPCR
10.0000 meq | ORAL_CAPSULE | Freq: Every day | ORAL | 3 refills | Status: AC
  Filled 2022-02-25: qty 90, 90d supply, fill #0

## 2022-02-25 MED ORDER — PROPRANOLOL HCL 10 MG PO TABS
10.0000 mg | ORAL_TABLET | Freq: Every day | ORAL | 3 refills | Status: DC
  Filled 2022-02-25: qty 50, 50d supply, fill #0
  Filled 2022-02-28: qty 40, 40d supply, fill #0
  Filled 2022-06-12: qty 90, 90d supply, fill #1

## 2022-02-25 MED ORDER — HYDROCHLOROTHIAZIDE 12.5 MG PO TABS
12.5000 mg | ORAL_TABLET | Freq: Every day | ORAL | 3 refills | Status: DC
  Filled 2022-02-25 – 2022-04-20 (×2): qty 90, 90d supply, fill #0
  Filled 2022-07-15: qty 90, 90d supply, fill #1
  Filled 2022-10-26: qty 90, 90d supply, fill #2
  Filled 2023-01-11: qty 90, 90d supply, fill #3

## 2022-02-25 MED ORDER — ROSUVASTATIN CALCIUM 5 MG PO TABS
5.0000 mg | ORAL_TABLET | Freq: Every day | ORAL | 3 refills | Status: DC
  Filled 2022-02-25: qty 90, 90d supply, fill #0
  Filled 2022-06-12: qty 90, 90d supply, fill #1
  Filled 2022-09-17: qty 90, 90d supply, fill #2
  Filled 2023-01-11: qty 90, 90d supply, fill #3

## 2022-02-25 MED ORDER — ELIQUIS 5 MG PO TABS
5.0000 mg | ORAL_TABLET | Freq: Two times a day (BID) | ORAL | 3 refills | Status: DC
  Filled 2022-02-25: qty 180, 90d supply, fill #0
  Filled 2022-06-12: qty 180, 90d supply, fill #1
  Filled 2022-09-17: qty 180, 90d supply, fill #2
  Filled 2022-12-26: qty 180, 90d supply, fill #3

## 2022-02-25 NOTE — Progress Notes (Signed)
Jody Sandoval is a 41 y.o. female with the following history as recorded in EpicCare:  Patient Active Problem List   Diagnosis Date Noted   FH: colon cancer 08/20/2021   Hyperlipidemia 08/20/2021   Right carpal tunnel syndrome 06/30/2021   Panic disorder 03/06/2019   Depression 11/21/2016   Bilateral pulmonary embolism ([place]) 05/09/2016   Essential hypertension 01/18/2013   ANEMIA-IRON DEFICIENCY 12/18/2009    Current Outpatient Medications  Medication Sig Dispense Refill   escitalopram (LEXAPRO) 20 MG tablet Take 20 mg by mouth daily.     ELIQUIS 5 MG TABS tablet Take 1 tablet (5 mg total) by mouth 2 (two) times daily. 180 tablet 3   hydrochlorothiazide (HYDRODIURIL) 12.5 MG tablet Take 1 tablet (12.5 mg total) by mouth daily. 90 tablet 3   potassium chloride (MICRO-K) 10 MEQ CR capsule Take 1 capsule (10 mEq total) by mouth daily. 90 capsule 3   propranolol (INDERAL) 10 MG tablet Take 1 tablet (10 mg total) by mouth daily. 90 tablet 3   rosuvastatin (CRESTOR) 5 MG tablet Take 1 tablet (5 mg total) by mouth daily. 90 tablet 3   valsartan (DIOVAN) 160 MG tablet Take 1 tablet (160 mg total) by mouth daily. 90 tablet 3   No current facility-administered medications for this visit.    Allergies: Nifedipine, Lisinopril, Tape, and Ferric carboxymaltose  Past Medical History:  Diagnosis Date   DVT (deep venous thrombosis) ([place]) 12/17/2008   Pulmonary embolism ([facility])     Past Surgical History:  Procedure Laterality Date   ABDOMINAL HYSTERECTOMY  09/03/2020   CESAREAN SECTION  04/18/2002   TONSILLECTOMY      Family History  Problem Relation Age of Onset   Hypertension Mother    Diabetes Mother    Hypertension Father    Heart attack Father     Social History   Tobacco Use   Smoking status: Never   Smokeless tobacco: Not on file  Substance Use Topics   Alcohol use: No    Subjective:   Presents for yearly CPE; no acute concerns today;   Health Maintenance  Topic Date  Due   Hepatitis C Screening  Never done   MAMMOGRAM  05/21/2022   COLONOSCOPY (Pts 45-14yr Insurance coverage will need to be confirmed)  09/02/2022   PAP SMEAR-Modifier  03/26/2023   TETANUS/TDAP  07/08/2026   INFLUENZA VACCINE  Completed   HIV Screening  Completed   HPV VACCINES  Aged Out   COVID-19 Vaccine  Discontinued   Review of Systems  Constitutional: Negative.   HENT: Negative.    Eyes: Negative.   Respiratory: Negative.    Cardiovascular: Negative.   Gastrointestinal: Negative.   Genitourinary: Negative.   Musculoskeletal: Negative.   Skin: Negative.   Neurological: Negative.   Endo/Heme/Allergies: Negative.   Psychiatric/Behavioral: Negative.       Objective:  Vitals:   02/25/22 1354  BP: 134/82  Pulse: 84  Temp: 98.4 F (36.9 C)  TempSrc: Oral  SpO2: 98%  Weight: 265 lb 12.8 oz (120.6 kg)  Height: _0  (1.702 m)    General: Well developed, well nourished, in no acute distress  Skin : Warm and dry.  Head: Normocephalic and atraumatic  Eyes: Sclera and conjunctiva clear; pupils round and reactive to light; extraocular movements intact  Ears: External normal; canals clear; tympanic membranes normal  Oropharynx: Pink, supple. No suspicious lesions  Neck: Supple without thyromegaly, adenopathy  Lungs: Respirations unlabored; clear to auscultation bilaterally without wheeze, rales, rhonchi  CVS exam: normal rate and regular rhythm.  Abdomen: Soft; nontender; nondistended; normoactive bowel sounds; no masses or hepatosplenomegaly  Musculoskeletal: No deformities; no active joint inflammation  Extremities: No edema, cyanosis, clubbing  Vessels: Symmetric bilaterally  Neurologic: Alert and oriented; speech intact; face symmetrical; moves all extremities well; CNII-XII intact without focal deficit   Assessment:  1. PE (physical exam), annual   2. Essential hypertension   3. Elevated glucose     Plan:  Age appropriate preventive healthcare needs  addressed; encouraged regular eye doctor and dental exams; encouraged regular exercise; will update labs and refills as needed today; follow-up in 6 months, sooner prn.    Return in about 6 months (around 08/26/2022).  Orders Placed This Encounter  Procedures   CBC with Differential/Platelet   Comp Met (CMET)   Lipid panel   Hemoglobin A1c    Requested Prescriptions   Signed Prescriptions Disp Refills   ELIQUIS 5 MG TABS tablet 180 tablet 3    Sig: Take 1 tablet (5 mg total) by mouth 2 (two) times daily.   hydrochlorothiazide (HYDRODIURIL) 12.5 MG tablet 90 tablet 3    Sig: Take 1 tablet (12.5 mg total) by mouth daily.   potassium chloride (MICRO-K) 10 MEQ CR capsule 90 capsule 3    Sig: Take 1 capsule (10 mEq total) by mouth daily.   propranolol (INDERAL) 10 MG tablet 90 tablet 3    Sig: Take 1 tablet (10 mg total) by mouth daily.   rosuvastatin (CRESTOR) 5 MG tablet 90 tablet 3    Sig: Take 1 tablet (5 mg total) by mouth daily.   valsartan (DIOVAN) 160 MG tablet 90 tablet 3    Sig: Take 1 tablet (160 mg total) by mouth daily.

## 2022-02-26 LAB — COMPREHENSIVE METABOLIC PANEL
AG Ratio: 1.5 (calc) (ref 1.0–2.5)
ALT: 19 U/L (ref 6–29)
AST: 15 U/L (ref 10–30)
Albumin: 4.1 g/dL (ref 3.6–5.1)
Alkaline phosphatase (APISO): 64 U/L (ref 31–125)
BUN: 13 mg/dL (ref 7–25)
CO2: 27 mmol/L (ref 20–32)
Calcium: 9.3 mg/dL (ref 8.6–10.2)
Chloride: 105 mmol/L (ref 98–110)
Creat: 0.73 mg/dL (ref 0.50–0.99)
Globulin: 2.8 g/dL (calc) (ref 1.9–3.7)
Glucose, Bld: 96 mg/dL (ref 65–99)
Potassium: 3.9 mmol/L (ref 3.5–5.3)
Sodium: 140 mmol/L (ref 135–146)
Total Bilirubin: 0.4 mg/dL (ref 0.2–1.2)
Total Protein: 6.9 g/dL (ref 6.1–8.1)

## 2022-02-26 LAB — CBC WITH DIFFERENTIAL/PLATELET
Absolute Monocytes: 508 cells/uL (ref 200–950)
Basophils Absolute: 28 cells/uL (ref 0–200)
Basophils Relative: 0.7 %
Eosinophils Absolute: 140 cells/uL (ref 15–500)
Eosinophils Relative: 3.5 %
HCT: 38.8 % (ref 35.0–45.0)
Hemoglobin: 13 g/dL (ref 11.7–15.5)
Lymphs Abs: 1200 cells/uL (ref 850–3900)
MCH: 31.1 pg (ref 27.0–33.0)
MCHC: 33.5 g/dL (ref 32.0–36.0)
MCV: 92.8 fL (ref 80.0–100.0)
MPV: 10.3 fL (ref 7.5–12.5)
Monocytes Relative: 12.7 %
Neutro Abs: 2124 cells/uL (ref 1500–7800)
Neutrophils Relative %: 53.1 %
Platelets: 206 10*3/uL (ref 140–400)
RBC: 4.18 10*6/uL (ref 3.80–5.10)
RDW: 12.2 % (ref 11.0–15.0)
Total Lymphocyte: 30 %
WBC: 4 10*3/uL (ref 3.8–10.8)

## 2022-02-26 LAB — LIPID PANEL
Cholesterol: 154 mg/dL (ref <200)
HDL: 52 mg/dL (ref >=50)
LDL Cholesterol (Calc): 80 mg/dL (calc)
Non-HDL Cholesterol (Calc): 102 mg/dL (calc) (ref <130)
Total CHOL/HDL Ratio: 3 (calc) (ref <5.0)
Triglycerides: 120 mg/dL (ref <150)

## 2022-02-26 LAB — HEMOGLOBIN A1C
Hgb A1c MFr Bld: 6.5 % of total Hgb — ABNORMAL HIGH (ref <5.7)
Mean Plasma Glucose: 140 mg/dL
eAG (mmol/L): 7.7 mmol/L

## 2022-02-28 ENCOUNTER — Other Ambulatory Visit (HOSPITAL_BASED_OUTPATIENT_CLINIC_OR_DEPARTMENT_OTHER): Payer: Self-pay

## 2022-03-01 ENCOUNTER — Other Ambulatory Visit (HOSPITAL_BASED_OUTPATIENT_CLINIC_OR_DEPARTMENT_OTHER): Payer: Self-pay

## 2022-03-03 ENCOUNTER — Other Ambulatory Visit (HOSPITAL_BASED_OUTPATIENT_CLINIC_OR_DEPARTMENT_OTHER): Payer: Self-pay

## 2022-03-24 ENCOUNTER — Encounter: Payer: Medicaid Other | Admitting: Family

## 2022-03-30 ENCOUNTER — Other Ambulatory Visit (HOSPITAL_BASED_OUTPATIENT_CLINIC_OR_DEPARTMENT_OTHER): Payer: Self-pay

## 2022-03-30 MED ORDER — ESCITALOPRAM OXALATE 20 MG PO TABS
20.0000 mg | ORAL_TABLET | Freq: Every day | ORAL | 0 refills | Status: DC
  Filled 2022-03-30: qty 90, 90d supply, fill #0

## 2022-03-30 MED ORDER — ESCITALOPRAM OXALATE 20 MG PO TABS
10.0000 mg | ORAL_TABLET | Freq: Every day | ORAL | 0 refills | Status: DC
  Filled 2022-03-30: qty 45, 90d supply, fill #0

## 2022-04-20 ENCOUNTER — Other Ambulatory Visit (HOSPITAL_BASED_OUTPATIENT_CLINIC_OR_DEPARTMENT_OTHER): Payer: Self-pay

## 2022-05-11 ENCOUNTER — Encounter: Payer: Self-pay | Admitting: Psychiatry

## 2022-05-18 NOTE — Progress Notes (Unsigned)
   CC:  headaches, vertigo  Follow-up Visit  Last visit: 06/04/21  Brief HPI: 42 year old female with a history of depression, HTN, HLD, DVT/PE on eliquis who follows in clinic for headaches and vertigo following a head trauma in 2022.  At her last visit, Buras was ordered and she was referred to PT for vertigo.  Interval History: Symptoms initially resolved with PT, however headaches and vertigo returned about 2 weeks ago. She is also experiencing significant neck tension. Vertigo is intermittent and triggered by lying down. It typically lasts for ~1 minute at a time. She would like to go back to PT as this previously helped her symptoms.  [facility] 06/10/21 was normal.  Current Headache Regimen: Preventative: none Abortive: none   Prior Therapies                                  Escitalopram Propranolol 10 mg BID Valsartan 160 mg daily Lisinopril - cough  Physical Exam:   Vital Signs: LMP 01/24/2011  GENERAL:  well appearing, in no acute distress, alert  SKIN:  Color, texture, turgor normal. No rashes or lesions HEAD:  Normocephalic/atraumatic. RESP: normal respiratory effort MSK:  No gross joint deformities.   NEUROLOGICAL: Mental Status: Alert, oriented to person, place and time, Follows commands, and Speech fluent and appropriate. Cranial Nerves: PERRL, face symmetric, no dysarthria, hearing grossly intact Motor: moves all extremities equally Gait: normal-based.  IMPRESSION: 42 year old female with a history of depression/anxiety, HTN, HLD, DVT/PE on eliquis who presents for follow up of post-traumatic headaches and vertigo. [facility] 05/20/21 was normal, images reviewed today. She had been doing well until about ~2 weeks ago when her symptoms returned. She is requesting new referral to PT as this was previously very effective for her. Will send back to PT for vertigo and cervicalgia.  PLAN: -Referral to PT for vertigo and cervicalgia   Follow-up: PRN  I spent a total of 16  minutes on the date of the service. Headache education was done. Discussed treatment options including physical therapy.   Genia Harold, MD 05/19/22 11:06 AM

## 2022-05-19 ENCOUNTER — Ambulatory Visit (INDEPENDENT_AMBULATORY_CARE_PROVIDER_SITE_OTHER): Payer: Managed Care, Other (non HMO) | Admitting: Psychiatry

## 2022-05-19 ENCOUNTER — Encounter: Payer: Self-pay | Admitting: Psychiatry

## 2022-05-19 VITALS — BP 153/102 | HR 81 | Ht 67.0 in | Wt 273.4 lb

## 2022-05-19 DIAGNOSIS — M542 Cervicalgia: Secondary | ICD-10-CM | POA: Diagnosis not present

## 2022-05-19 DIAGNOSIS — R42 Dizziness and giddiness: Secondary | ICD-10-CM | POA: Diagnosis not present

## 2022-05-27 ENCOUNTER — Encounter: Payer: Self-pay | Admitting: Physical Therapy

## 2022-06-01 ENCOUNTER — Ambulatory Visit: Payer: Managed Care, Other (non HMO) | Attending: Psychiatry | Admitting: Physical Therapy

## 2022-06-01 DIAGNOSIS — R42 Dizziness and giddiness: Secondary | ICD-10-CM | POA: Insufficient documentation

## 2022-06-01 DIAGNOSIS — M542 Cervicalgia: Secondary | ICD-10-CM | POA: Diagnosis present

## 2022-06-01 NOTE — Therapy (Addendum)
OUTPATIENT PHYSICAL THERAPY VESTIBULAR EVALUATION     Patient Name: Jody Sandoval MRN: [id number] DOB:April 20, 1980, 42 y.o., female Today's Date: 06/01/2022  END OF SESSION:  PT End of Session - 06/01/22 1739     Visit Number 1    Number of Visits 12    Date for PT Re-Evaluation 07/13/22    Authorization Type [employer]    PT Start Time 1020    PT Stop Time 1100    PT Time Calculation (min) 40 min    Activity Tolerance Patient tolerated treatment well    Behavior During Therapy [facility] for tasks assessed/performed             Past Medical History:  Diagnosis Date   DVT (deep venous thrombosis) ([place]) 12/17/2008   Pulmonary embolism ([place])    Past Surgical History:  Procedure Laterality Date   ABDOMINAL HYSTERECTOMY  09/03/2020   CESAREAN SECTION  04/18/2002   TONSILLECTOMY     Patient Active Problem List   Diagnosis Date Noted   FH: colon cancer 08/20/2021   Hyperlipidemia 08/20/2021   Right carpal tunnel syndrome 06/30/2021   Panic disorder 03/06/2019   Depression 11/21/2016   Bilateral pulmonary embolism ([place]) 05/09/2016   Essential hypertension 01/18/2013   ANEMIA-IRON DEFICIENCY 12/18/2009    PCP: Marrian Salvage, FNP  REFERRING PROVIDER: Genia Harold, MD   REFERRING DIAG:  M54.2 (ICD-10-CM) - Cervicalgia  R42 (ICD-10-CM) - Vertigo    THERAPY DIAG:  Cervicalgia  Dizziness and giddiness  ONSET DATE: January 2024  Rationale for Evaluation and Treatment: Rehabilitation  SUBJECTIVE:   SUBJECTIVE STATEMENT: Patient reports she had dizziness in the past and did great, dizziness and headaches resolved.  Symptoms have returned and requested return to PT.  Has not tried exercises given in the past. World spins, when bending down, getting up, first thing in morning, occasionally rolling over in bed.  Has 3 children, ages 2, 61, and 5 Pt accompanied by: self  PERTINENT HISTORY: history of PE and blood clots, HTN, dizzines and  vertigo.  PAIN:  Are you having pain? Yes: NPRS scale: 3/10 Pain location: neck Pain description: headache Aggravating factors: bending down Relieving factors: getting back up  PRECAUTIONS: None  WEIGHT BEARING RESTRICTIONS: No  FALLS: Has patient fallen in last 6 months? No  LIVING ENVIRONMENT: Lives with: lives with their family Lives in: House/apartment Stairs: No Has following equipment at home: None  PLOF: Independent  PATIENT GOALS: get rid of dizziness  OBJECTIVE:   DIAGNOSTIC FINDINGS: 06/10/21 Normal CT head (without).     COGNITION: Overall cognitive status: Within functional limits for tasks assessed   POSTURE:  rounded shoulders and forward head  Cervical ROM:    Active AROM (deg) eval  Flexion 50* pain/heaviness  Extension 22  Right lateral flexion 25  Left lateral flexion 26  Right rotation 55  Left rotation 58  (Blank rows = not tested)  STRENGTH: slight weakness R wrist (history of R carpal tunnel), all other 5/5  LOWER EXTREMITY MMT:  5/5 bil LE strength  GAIT: Gait pattern: WFL Distance walked: 34' Assistive device utilized: None Level of assistance: Complete Independence Comments: no deviations.   FUNCTIONAL TESTS:  NT  PATIENT SURVEYS:  DHI 56% NDI 15/50= 30%  VESTIBULAR ASSESSMENT:  GENERAL OBSERVATION: no apparent distress.    SYMPTOM BEHAVIOR:  Subjective history: see above  Non-Vestibular symptoms: headaches  Type of dizziness: Spinning/Vertigo  Frequency: ~ 2 x week  Duration: 2-3 minutes  Aggravating factors: Induced by position change: supine to sit and sit to stand bending down, tying shoes  Relieving factors: head stationary, closing eyes, and rest  Progression of symptoms: unchanged  OCULOMOTOR EXAM:  Ocular Alignment: normal  Ocular ROM: No Limitations  Spontaneous Nystagmus: absent  Gaze-Induced Nystagmus: age appropriate nystagmus at end range  Smooth Pursuits: intact  Saccades:  intact  Convergence/Divergence: 13 cm     VESTIBULAR - OCULAR REFLEX:   Slow VOR: Normal  VOR Cancellation: Normal  Head-Impulse Test: HIT Right: negative  Dynamic Visual Acuity: NT   POSITIONAL TESTING: Right Dix-Hallpike: no nystagmus Left Dix-Hallpike: no nystagmus Right Roll Test: no nystagmus Left Roll Test: no nystagmus Right Sidelying: no nystagmus Left Sidelying: no nystagmus  MOTION SENSITIVITY:  Motion Sensitivity Quotient Intensity: 0 = none, 1 = Lightheaded, 2 = Mild, 3 = Moderate, 4 = Severe, 5 = Vomiting  Intensity  1. Sitting to supine   2. Supine to L side   3. Supine to R side   4. Supine to sitting   5. L Hallpike-Dix   6. Up from L    7. R Hallpike-Dix   8. Up from R    9. Sitting, head tipped to L knee   10. Head up from L knee   11. Sitting, head tipped to R knee   12. Head up from R knee   13. Sitting head turns x5   14.Sitting head nods x5   15. In stance, 180 turn to L    16. In stance, 180 turn to R     OTHOSTATICS: not done  FUNCTIONAL GAIT: NT  PALPATION:  tenderness/tightness bil suboccipitals, cervical paraspinals, bil UT, hypomobility cervical spine.   TREATMENT:                                                                                                   DATE:    PATIENT EDUCATION: Education details: findings, POC Person educated: Patient Education method: Explanation Education comprehension: verbalized understanding  HOME EXERCISE PROGRAM: TBA  GOALS: Goals reviewed with patient? Yes  SHORT TERM GOALS: Target date: 06/15/2022   Patient will be independent with initial HEP.  Baseline: needs HEP Goal status: INITIAL   LONG TERM GOALS: Target date: 07/13/2022   Patient will be independent with advanced/ongoing HEP to improve outcomes and carryover.  Baseline:  Goal status: INITIAL  2.  Patient will report 75% improvement in neck pain/headache to improve QOL.  Baseline:  Goal status: INITIAL  3.  Patient  will demonstrate full pain free cervical ROM for safety with driving.  Baseline: see objective, guarded movements Goal status: INITIAL  4.  Patient will report 15% improvement on NDI to demonstrate improved functional ability.  Baseline: 30% impairment Goal status: INITIAL  5.  Patient will report 75% improvement in vertigo symptoms to improve QOL. Baseline:  Goal status: INITIAL  6. Patient will will report 18% improvement on DHI to demonstrate improved functional ability    Baseline: 56% impairment Goal status: INITIAL   ASSESSMENT:  CLINICAL IMPRESSION: Patient is a 42 y.o. right hand  dominant female who was seen today for physical therapy evaluation and treatment for cervicalgia and dizziness.  She was seen previously for cervigogenic dizziness and had symptoms resolve.  She has not kept up with her HEP and symptoms have returned.  She was negative for BPPV or vestibular hypofunction, her dizziness is again mostly likely cervicogenic and connected to neck pain and headache.  She would benefit from skilled physical therapy to decrease dizziness and pain in order to improve QOL.    OBJECTIVE IMPAIRMENTS: decreased activity tolerance, decreased ROM, dizziness, hypomobility, increased fascial restrictions, increased muscle spasms, postural dysfunction, and pain.   ACTIVITY LIMITATIONS: carrying, lifting, bending, standing, squatting, sleeping, transfers, bed mobility, locomotion level, and caring for others  PARTICIPATION LIMITATIONS: meal prep, cleaning, laundry, shopping, and occupation  PERSONAL FACTORS: Behavior pattern, Past/current experiences, Time since onset of injury/illness/exacerbation, and 1-2 comorbidities: HTN, history DVT  are also affecting patient's functional outcome.   REHAB POTENTIAL: Good  CLINICAL DECISION MAKING: Evolving/moderate complexity  EVALUATION COMPLEXITY: Moderate   PLAN:  PT FREQUENCY: 1-2x/week  PT DURATION: 6 weeks  PLANNED INTERVENTIONS:  Therapeutic exercises, Therapeutic activity, Neuromuscular re-education, Balance training, Gait training, Patient/Family education, Self Care, Joint mobilization, Vestibular training, Dry Needling, Electrical stimulation, Spinal mobilization, Cryotherapy, Moist heat, Traction, Ultrasound, Manual therapy, and Re-evaluation  PLAN FOR NEXT SESSION: review postural strengthening exercises, manual therapy, - had TrDN in past and did not like.    Rennie Natter, PT, DPT  06/01/2022, 5:56 PM   PHYSICAL THERAPY DISCHARGE SUMMARY  Visits from Start of Care: 1 (evaluation only)  Current functional level related to goals / functional outcomes: See above   Remaining deficits: See above   Education / Equipment: See above  Plan: Patient agrees to discharge.  Patient goals were not met. Patient is being discharged by request.  Her medicaid is not active and she is unable to afford therapy at this time.    Rennie Natter, PT, DPT 3:58 PM 06/09/2022

## 2022-06-13 ENCOUNTER — Ambulatory Visit: Payer: Medicaid Other | Admitting: Psychiatry

## 2022-06-14 ENCOUNTER — Ambulatory Visit: Payer: Managed Care, Other (non HMO) | Admitting: Physical Therapy

## 2022-06-15 ENCOUNTER — Encounter: Payer: Managed Care, Other (non HMO) | Admitting: Physical Therapy

## 2022-06-24 ENCOUNTER — Ambulatory Visit: Payer: Managed Care, Other (non HMO) | Admitting: Physical Therapy

## 2022-06-29 ENCOUNTER — Encounter: Payer: Managed Care, Other (non HMO) | Admitting: Physical Therapy

## 2022-06-30 DIAGNOSIS — F321 Major depressive disorder, single episode, moderate: Secondary | ICD-10-CM | POA: Insufficient documentation

## 2022-07-08 ENCOUNTER — Encounter: Payer: Managed Care, Other (non HMO) | Admitting: Physical Therapy

## 2022-07-15 ENCOUNTER — Encounter: Payer: Managed Care, Other (non HMO) | Admitting: Physical Therapy

## 2022-07-20 ENCOUNTER — Other Ambulatory Visit (HOSPITAL_BASED_OUTPATIENT_CLINIC_OR_DEPARTMENT_OTHER): Payer: Self-pay

## 2022-07-22 ENCOUNTER — Encounter: Payer: Managed Care, Other (non HMO) | Admitting: Physical Therapy

## 2022-08-25 ENCOUNTER — Other Ambulatory Visit (HOSPITAL_BASED_OUTPATIENT_CLINIC_OR_DEPARTMENT_OTHER): Payer: Self-pay

## 2022-08-25 ENCOUNTER — Ambulatory Visit: Payer: Managed Care, Other (non HMO) | Admitting: Family

## 2022-08-25 ENCOUNTER — Encounter: Payer: Self-pay | Admitting: Family

## 2022-08-25 VITALS — BP 136/76 | HR 75 | Ht 67.0 in | Wt 272.0 lb

## 2022-08-25 DIAGNOSIS — R7309 Other abnormal glucose: Secondary | ICD-10-CM | POA: Diagnosis not present

## 2022-08-25 DIAGNOSIS — F419 Anxiety disorder, unspecified: Secondary | ICD-10-CM

## 2022-08-25 DIAGNOSIS — I10 Essential (primary) hypertension: Secondary | ICD-10-CM | POA: Diagnosis not present

## 2022-08-25 DIAGNOSIS — F32A Depression, unspecified: Secondary | ICD-10-CM | POA: Diagnosis not present

## 2022-08-25 DIAGNOSIS — Z1231 Encounter for screening mammogram for malignant neoplasm of breast: Secondary | ICD-10-CM

## 2022-08-25 MED ORDER — PROPRANOLOL HCL 10 MG PO TABS
10.0000 mg | ORAL_TABLET | Freq: Two times a day (BID) | ORAL | 1 refills | Status: DC
  Filled 2022-08-25 – 2022-10-04 (×2): qty 180, 90d supply, fill #0
  Filled 2023-04-05: qty 180, 90d supply, fill #1

## 2022-08-25 NOTE — Progress Notes (Signed)
Jody Sandoval is a 42 y.o. female with the following history as recorded in EpicCare:  Patient Active Problem List   Diagnosis Date Noted   FH: colon cancer 08/20/2021   Hyperlipidemia 08/20/2021   Right carpal tunnel syndrome 06/30/2021   Panic disorder 03/06/2019   Depression 11/21/2016   Bilateral pulmonary embolism ([facility]) 05/09/2016   Essential hypertension 01/18/2013   ANEMIA-IRON DEFICIENCY 12/18/2009    Current Outpatient Medications  Medication Sig Dispense Refill   ELIQUIS 5 MG TABS tablet Take 1 tablet (5 mg total) by mouth 2 (two) times daily. 180 tablet 3   escitalopram (LEXAPRO) 20 MG tablet Take 1 tablet (20 mg total) by mouth daily. 90 tablet 0   hydrochlorothiazide (HYDRODIURIL) 12.5 MG tablet Take 1 tablet (12.5 mg total) by mouth daily. 90 tablet 3   potassium chloride (MICRO-K) 10 MEQ CR capsule Take 1 capsule (10 mEq total) by mouth daily. 90 capsule 3   rosuvastatin (CRESTOR) 5 MG tablet Take 1 tablet (5 mg total) by mouth daily. 90 tablet 3   valsartan (DIOVAN) 160 MG tablet Take 1 tablet (160 mg total) by mouth daily. 90 tablet 3   propranolol (INDERAL) 10 MG tablet Take 1 tablet (10 mg total) by mouth 2 (two) times daily. 180 tablet 1   No current facility-administered medications for this visit.    Allergies: Nifedipine, Lisinopril, Tape, and Ferric carboxymaltose  Past Medical History:  Diagnosis Date   DVT (deep venous thrombosis) ([facility]) 12/17/2008   Pulmonary embolism ([facility])     Past Surgical History:  Procedure Laterality Date   ABDOMINAL HYSTERECTOMY  09/03/2020   CESAREAN SECTION  04/18/2002   TONSILLECTOMY      Family History  Problem Relation Age of Onset   Hypertension Mother    Diabetes Mother    Hypertension Father    Heart attack Father     Social History   Tobacco Use   Smoking status: Never   Smokeless tobacco: Not on file  Substance Use Topics   Alcohol use: No    Subjective:   6 month follow up on chronic care needs;   Does not check blood pressure regularly- admits that stress level has been up; Denies any chest pain, shortness of breath, blurred vision or headache   Objective:  Vitals:   08/25/22 1341 08/25/22 1405  BP: (!) 156/88 136/76  Pulse: 75   SpO2: 98%   Weight: 272 lb (123.4 kg)   Height: 5\' 7"  (1.702 m)     General: Well developed, well nourished, in no acute distress  Skin : Warm and dry.  Head: Normocephalic and atraumatic  Lungs: Respirations unlabored; clear to auscultation bilaterally without wheeze, rales, rhonchi  CVS exam: normal rate and regular rhythm.  Neurologic: Alert and oriented; speech intact; face symmetrical; moves all extremities well; CNII-XII intact without focal deficit   Assessment:  1. Primary hypertension   2. Anxiety and depression   3. Visit for screening mammogram   4. Elevated glucose     Plan:  Concern for control- will increase Inderal to 10 mg bid; continue HCTZ and Valsartan; discussed that this increased dosage of Propanolol can be beneficial for anxiety as well. Order for screening mammogram; Check Hgba1c today; continue to work on limiting intake of refined sugars; may need to consider starting medication if Hgba1c is above 7;   No follow-ups on file.  Orders Placed This Encounter  Procedures   MM Digital Screening    Standing Status:  Future    Standing Expiration Date:   08/25/2023    Order Specific Question:   Reason for Exam (SYMPTOM  OR DIAGNOSIS REQUIRED)    Answer:   screening mammogram    Order Specific Question:   Is the patient pregnant?    Answer:   No    Order Specific Question:   Preferred imaging location?    Answer:   [facility]   CBC with Differential/Platelet   Comp Met (CMET)   Hemoglobin A1c    Requested Prescriptions   Signed Prescriptions Disp Refills   propranolol (INDERAL) 10 MG tablet 180 tablet 1    Sig: Take 1 tablet (10 mg total) by mouth 2 (two) times daily.

## 2022-08-26 ENCOUNTER — Ambulatory Visit: Payer: Managed Care, Other (non HMO) | Admitting: Family

## 2022-08-26 ENCOUNTER — Telehealth: Payer: Self-pay | Admitting: *Deleted

## 2022-08-26 LAB — COMPREHENSIVE METABOLIC PANEL
ALT: 21 U/L (ref 0–35)
AST: 22 U/L (ref 0–37)
Albumin: 4 g/dL (ref 3.5–5.2)
Alkaline Phosphatase: 62 U/L (ref 39–117)
BUN: 15 mg/dL (ref 6–23)
CO2: 26 mEq/L (ref 19–32)
Calcium: 9.5 mg/dL (ref 8.4–10.5)
Chloride: 103 mEq/L (ref 96–112)
Creatinine, Ser: 0.78 mg/dL (ref 0.40–1.20)
GFR: 92.43 mL/min (ref >60.00)
Glucose, Bld: 105 mg/dL — ABNORMAL HIGH (ref 70–99)
Potassium: 4.3 mEq/L (ref 3.5–5.1)
Sodium: 140 mEq/L (ref 135–145)
Total Bilirubin: 0.4 mg/dL (ref 0.2–1.2)
Total Protein: 6.7 g/dL (ref 6.0–8.3)

## 2022-08-26 NOTE — Telephone Encounter (Signed)
Received call from lab stating they are unable to complete CBC or A1c on specimen from yesterday as QNS. Specimen will need to be redrawn and pt will need another lab appt if he is agreeable.  Left message for pt to return my call.

## 2022-08-26 NOTE — Telephone Encounter (Signed)
Patient returned call and scheduled for a lab appointment to repeat labs at no charge on 08/30/2022 at 10:15 am

## 2022-08-29 NOTE — Addendum Note (Signed)
Addended by: Mervin Kung A on: 08/29/2022 11:57 AM   Modules accepted: Orders

## 2022-08-30 ENCOUNTER — Other Ambulatory Visit (INDEPENDENT_AMBULATORY_CARE_PROVIDER_SITE_OTHER): Payer: Managed Care, Other (non HMO)

## 2022-08-30 DIAGNOSIS — R7309 Other abnormal glucose: Secondary | ICD-10-CM | POA: Diagnosis not present

## 2022-08-30 LAB — CBC WITH DIFFERENTIAL/PLATELET
Basophils Absolute: 0 10*3/uL (ref 0.0–0.1)
Basophils Relative: 0.5 % (ref 0.0–3.0)
Eosinophils Absolute: 0.2 10*3/uL (ref 0.0–0.7)
Eosinophils Relative: 4.1 % (ref 0.0–5.0)
HCT: 39.8 % (ref 36.0–46.0)
Hemoglobin: 13.1 g/dL (ref 12.0–15.0)
Lymphocytes Relative: 30.1 % (ref 12.0–46.0)
Lymphs Abs: 1.7 10*3/uL (ref 0.7–4.0)
MCHC: 33 g/dL (ref 30.0–36.0)
MCV: 93 fl (ref 78.0–100.0)
Monocytes Absolute: 0.4 10*3/uL (ref 0.1–1.0)
Monocytes Relative: 6.3 % (ref 3.0–12.0)
Neutro Abs: 3.3 10*3/uL (ref 1.4–7.7)
Neutrophils Relative %: 59 % (ref 43.0–77.0)
Platelets: 220 10*3/uL (ref 150.0–400.0)
RBC: 4.28 Mil/uL (ref 3.87–5.11)
RDW: 13.4 % (ref 11.5–15.5)
WBC: 5.5 10*3/uL (ref 4.0–10.5)

## 2022-08-30 LAB — HEMOGLOBIN A1C: Hgb A1c MFr Bld: 6.6 % — ABNORMAL HIGH (ref 4.6–6.5)

## 2022-09-06 ENCOUNTER — Other Ambulatory Visit (HOSPITAL_BASED_OUTPATIENT_CLINIC_OR_DEPARTMENT_OTHER): Payer: Self-pay

## 2022-09-21 ENCOUNTER — Other Ambulatory Visit (HOSPITAL_BASED_OUTPATIENT_CLINIC_OR_DEPARTMENT_OTHER): Payer: Self-pay

## 2022-09-21 MED ORDER — ESCITALOPRAM OXALATE 20 MG PO TABS
20.0000 mg | ORAL_TABLET | Freq: Every day | ORAL | 0 refills | Status: DC
  Filled 2022-09-21: qty 90, 90d supply, fill #0

## 2022-10-05 ENCOUNTER — Other Ambulatory Visit (HOSPITAL_BASED_OUTPATIENT_CLINIC_OR_DEPARTMENT_OTHER): Payer: Self-pay

## 2022-10-06 ENCOUNTER — Inpatient Hospital Stay (HOSPITAL_BASED_OUTPATIENT_CLINIC_OR_DEPARTMENT_OTHER): Admission: RE | Admit: 2022-10-06 | Payer: Managed Care, Other (non HMO) | Source: Ambulatory Visit

## 2022-10-11 IMAGING — MG MM DIGITAL SCREENING BILAT W/ TOMO AND CAD
8 series · 8 of 24 positions shown · non-contrast
Comparison: Previous exam(s).

CLINICAL DATA: Screening.

EXAM:
DIGITAL SCREENING BILATERAL MAMMOGRAM WITH TOMOSYNTHESIS AND CAD
TECHNIQUE: Bilateral screening digital craniocaudal and mediolateral oblique
mammograms were obtained. Bilateral screening digital breast
tomosynthesis was performed. The images were evaluated with
computer-aided detection.

[L MLO synth-2D]
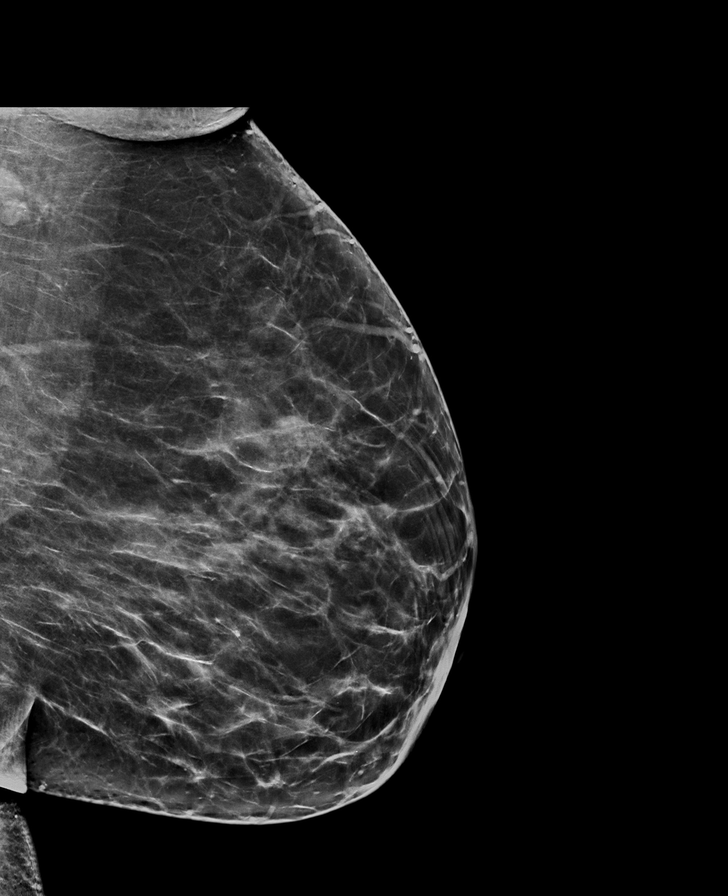

[L CC synth-2D]
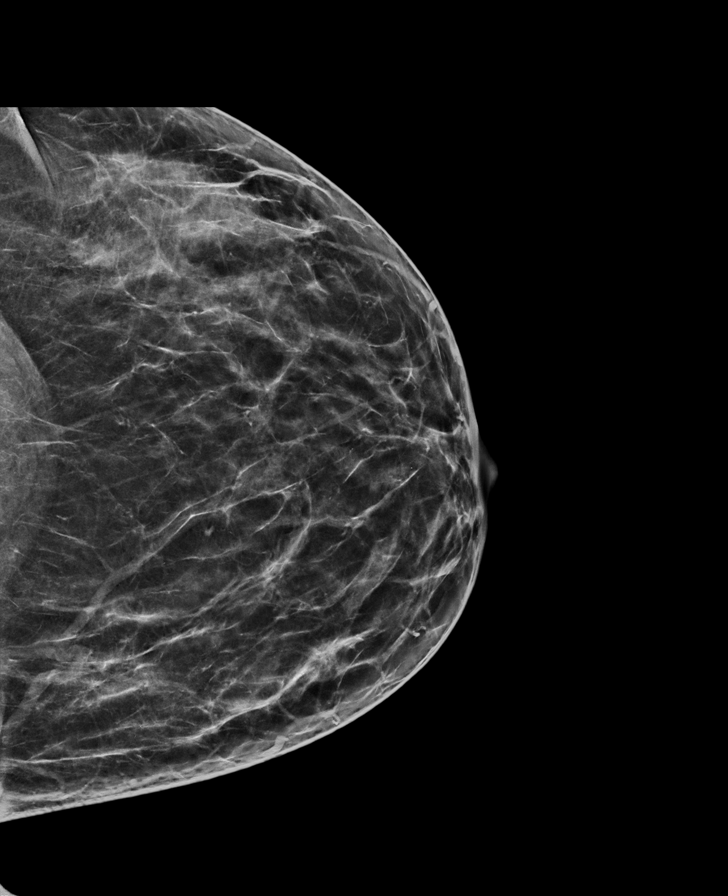

[R MLO synth-2D]
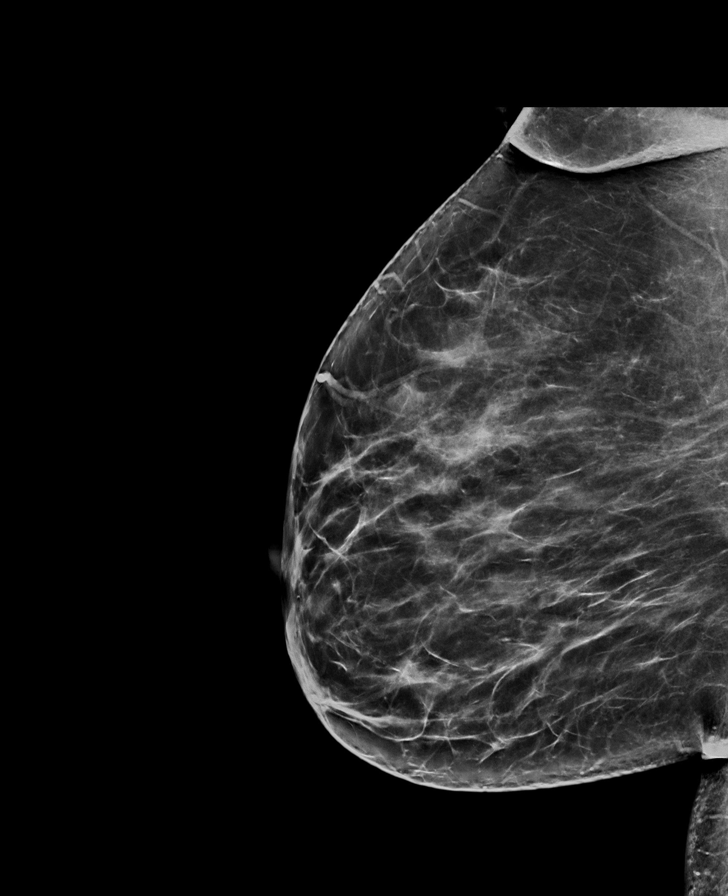

[R CC synth-2D]
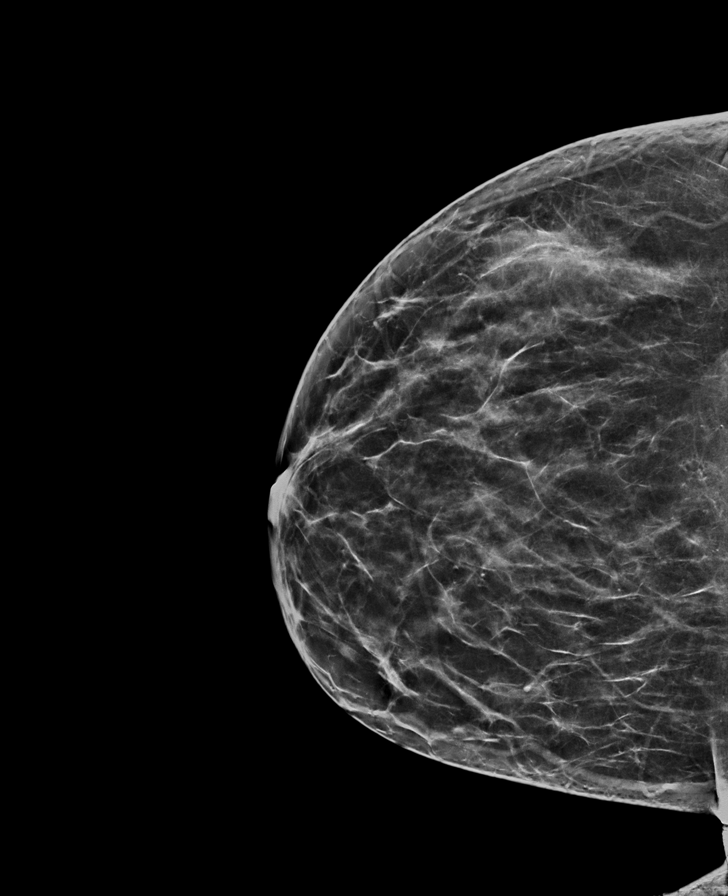

[L MLO tomo · tomo slice 40/79.0]
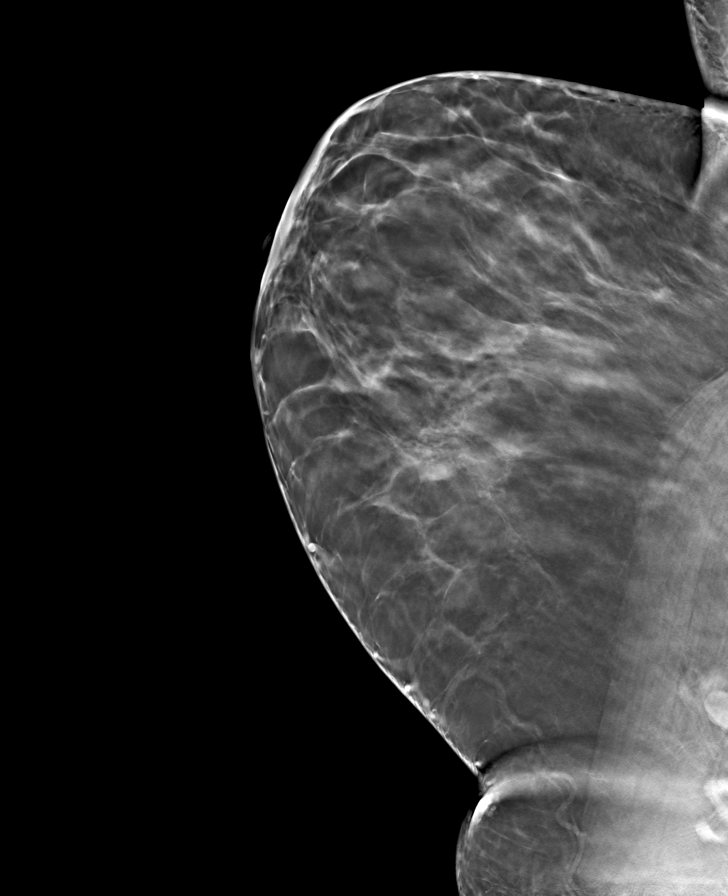

[R MLO tomo · tomo slice 39/77.0]
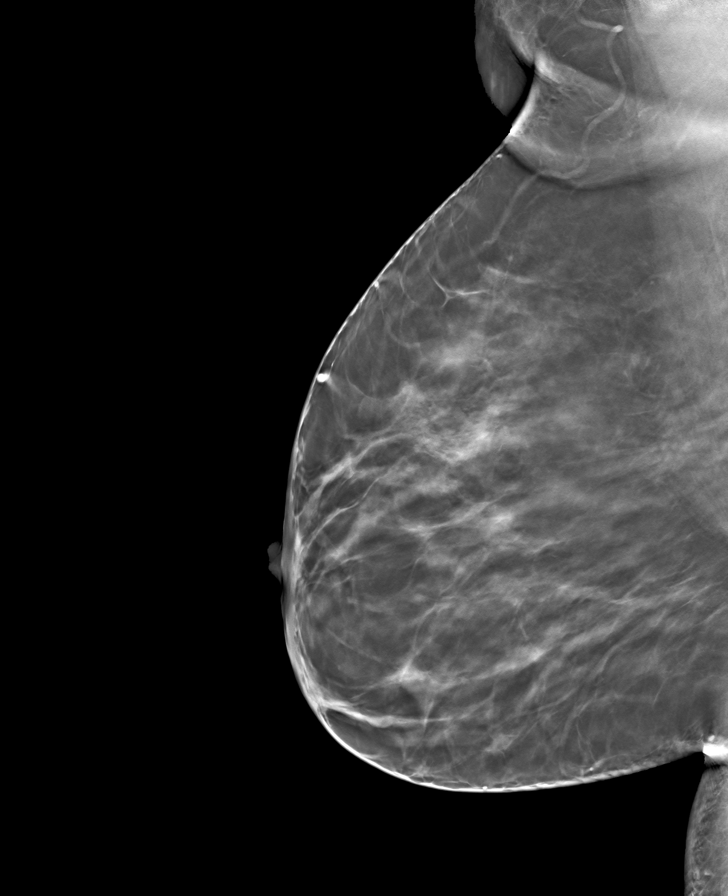

[R CC tomo · tomo slice 35/68.0]
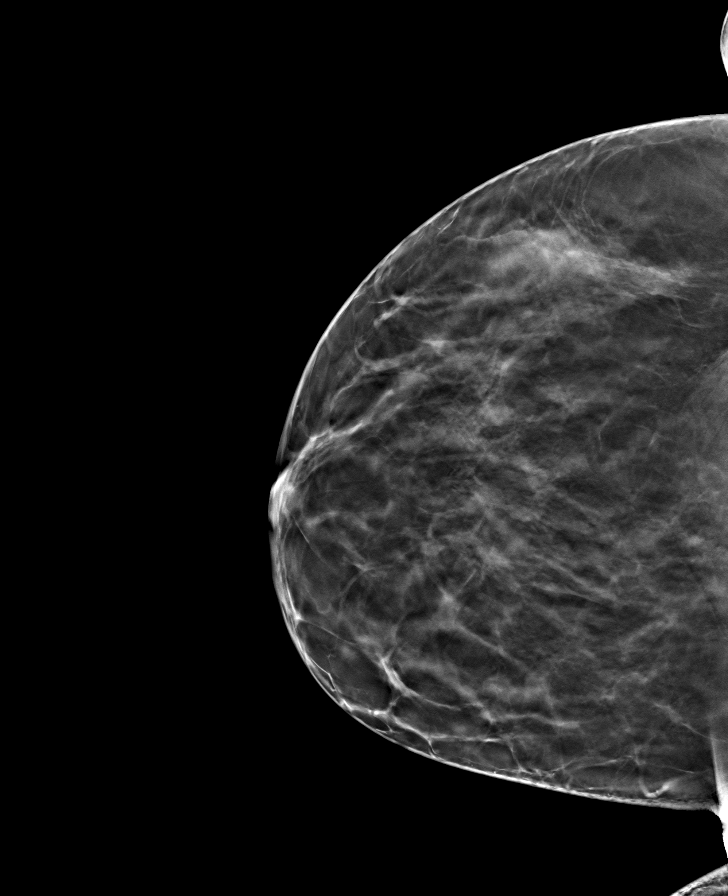

[L CC tomo · tomo slice 35/70.0]
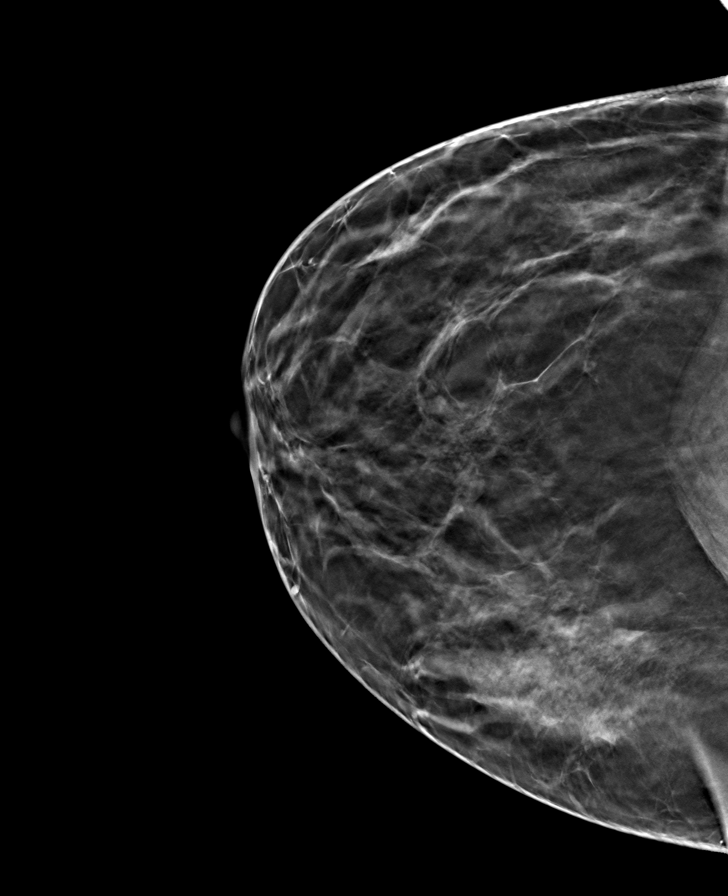

[8 of 24 positions shown; findings below may reference images not displayed]

ACR Breast Density Category c: The breast tissue is heterogeneously
dense, which may obscure small masses.
FINDINGS: There are no findings suspicious for malignancy.
IMPRESSION: No mammographic evidence of malignancy. A result letter of this
screening mammogram will be mailed directly to the patient.

RECOMMENDATION:
Screening mammogram in one year. (Code:Q3-[id number])

BI-RADS CATEGORY  1: Negative.

## 2022-10-24 ENCOUNTER — Ambulatory Visit (HOSPITAL_BASED_OUTPATIENT_CLINIC_OR_DEPARTMENT_OTHER)
Admission: RE | Admit: 2022-10-24 | Discharge: 2022-10-24 | Disposition: A | Discharge status: Home / Self Care | Payer: Managed Care, Other (non HMO) | Source: Ambulatory Visit | Attending: Family | Admitting: Family

## 2022-10-24 ENCOUNTER — Encounter (HOSPITAL_BASED_OUTPATIENT_CLINIC_OR_DEPARTMENT_OTHER): Payer: Self-pay

## 2022-10-24 DIAGNOSIS — Z1231 Encounter for screening mammogram for malignant neoplasm of breast: Secondary | ICD-10-CM | POA: Diagnosis present

## 2022-10-27 ENCOUNTER — Other Ambulatory Visit (HOSPITAL_BASED_OUTPATIENT_CLINIC_OR_DEPARTMENT_OTHER): Payer: Self-pay

## 2022-11-13 ENCOUNTER — Other Ambulatory Visit (HOSPITAL_BASED_OUTPATIENT_CLINIC_OR_DEPARTMENT_OTHER): Payer: Self-pay

## 2022-11-13 MED ORDER — FLUCONAZOLE 150 MG PO TABS
ORAL_TABLET | ORAL | 0 refills | Status: DC
  Filled 2022-11-13: qty 2, 2d supply, fill #0

## 2022-11-13 MED ORDER — METRONIDAZOLE 500 MG PO TABS
500.0000 mg | ORAL_TABLET | Freq: Two times a day (BID) | ORAL | 0 refills | Status: DC
  Filled 2022-11-13: qty 14, 7d supply, fill #0

## 2022-11-14 ENCOUNTER — Other Ambulatory Visit (HOSPITAL_BASED_OUTPATIENT_CLINIC_OR_DEPARTMENT_OTHER): Payer: Self-pay

## 2022-11-29 ENCOUNTER — Ambulatory Visit: Payer: Managed Care, Other (non HMO) | Admitting: Family

## 2022-12-01 ENCOUNTER — Ambulatory Visit: Payer: Managed Care, Other (non HMO) | Admitting: Family

## 2022-12-01 ENCOUNTER — Encounter: Payer: Self-pay | Admitting: Family

## 2022-12-01 ENCOUNTER — Other Ambulatory Visit (HOSPITAL_COMMUNITY)
Admission: RE | Admit: 2022-12-01 | Discharge: 2022-12-01 | Disposition: A | Discharge status: Home / Self Care | Payer: Managed Care, Other (non HMO) | Source: Ambulatory Visit | Attending: Family | Admitting: Family

## 2022-12-01 VITALS — BP 144/92 | HR 88 | Resp 18 | Ht 67.0 in | Wt 268.6 lb

## 2022-12-01 DIAGNOSIS — R7309 Other abnormal glucose: Secondary | ICD-10-CM | POA: Diagnosis not present

## 2022-12-01 DIAGNOSIS — Z113 Encounter for screening for infections with a predominantly sexual mode of transmission: Secondary | ICD-10-CM | POA: Insufficient documentation

## 2022-12-01 DIAGNOSIS — Z202 Contact with and (suspected) exposure to infections with a predominantly sexual mode of transmission: Secondary | ICD-10-CM | POA: Diagnosis not present

## 2022-12-01 LAB — COMPREHENSIVE METABOLIC PANEL
ALT: 23 U/L (ref 0–35)
AST: 19 U/L (ref 0–37)
Albumin: 4.3 g/dL (ref 3.5–5.2)
Alkaline Phosphatase: 55 U/L (ref 39–117)
BUN: 15 mg/dL (ref 6–23)
CO2: 30 mEq/L (ref 19–32)
Calcium: 9.4 mg/dL (ref 8.4–10.5)
Chloride: 99 mEq/L (ref 96–112)
Creatinine, Ser: 0.66 mg/dL (ref 0.40–1.20)
GFR: 106.55 mL/min (ref >60.00)
Glucose, Bld: 108 mg/dL — ABNORMAL HIGH (ref 70–99)
Potassium: 3.5 mEq/L (ref 3.5–5.1)
Sodium: 136 mEq/L (ref 135–145)
Total Bilirubin: 0.4 mg/dL (ref 0.2–1.2)
Total Protein: 6.9 g/dL (ref 6.0–8.3)

## 2022-12-01 LAB — HEMOGLOBIN A1C: Hgb A1c MFr Bld: 6.6 % — ABNORMAL HIGH (ref 4.6–6.5)

## 2022-12-01 NOTE — Progress Notes (Signed)
Jody Sandoval is a 42 y.o. female with the following history as recorded in EpicCare:  Patient Active Problem List   Diagnosis Date Noted   FH: colon cancer 08/20/2021   Hyperlipidemia 08/20/2021   Right carpal tunnel syndrome 06/30/2021   Panic disorder 03/06/2019   Depression 11/21/2016   Bilateral pulmonary embolism ([facility]) 05/09/2016   Essential hypertension 01/18/2013   ANEMIA-IRON DEFICIENCY 12/18/2009    Current Outpatient Medications  Medication Sig Dispense Refill   ELIQUIS 5 MG TABS tablet Take 1 tablet (5 mg total) by mouth 2 (two) times daily. 180 tablet 3   escitalopram (LEXAPRO) 20 MG tablet Take 1 tablet (20 mg total) by mouth daily. 90 tablet 0   escitalopram (LEXAPRO) 20 MG tablet Take 1 tablet (20 mg total) by mouth daily. 90 tablet 0   fluconazole (DIFLUCAN) 150 MG tablet Take 1 tablet by mouth 1 times a day. May take the 2nd pill by mouth 2 days later if symptoms are still present. 2 tablet 0   hydrochlorothiazide (HYDRODIURIL) 12.5 MG tablet Take 1 tablet (12.5 mg total) by mouth daily. 90 tablet 3   metroNIDAZOLE (FLAGYL) 500 MG tablet Take 1 tablet by mouth 2 times a day. Do not consume alcohol while taking this product. 14 tablet 0   potassium chloride (MICRO-K) 10 MEQ CR capsule Take 1 capsule (10 mEq total) by mouth daily. 90 capsule 3   propranolol (INDERAL) 10 MG tablet Take 1 tablet (10 mg total) by mouth 2 (two) times daily. 180 tablet 1   rosuvastatin (CRESTOR) 5 MG tablet Take 1 tablet (5 mg total) by mouth daily. 90 tablet 3   valsartan (DIOVAN) 160 MG tablet Take 1 tablet (160 mg total) by mouth daily. 90 tablet 3   No current facility-administered medications for this visit.    Allergies: Nifedipine, Lisinopril, Tape, and Ferric carboxymaltose  Past Medical History:  Diagnosis Date   DVT (deep venous thrombosis) ([facility]) 12/17/2008   Pulmonary embolism ([facility])     Past Surgical History:  Procedure Laterality Date   ABDOMINAL HYSTERECTOMY  09/03/2020    CESAREAN SECTION  04/18/2002   TONSILLECTOMY      Family History  Problem Relation Age of Onset   Hypertension Mother    Diabetes Mother    Hypertension Father    Heart attack Father     Social History   Tobacco Use   Smoking status: Never   Smokeless tobacco: Not on file  Substance Use Topics   Alcohol use: No    Subjective:   Unfortunately patient has recently found out that her husband has been having extramarital affairs; no specific concern for STD but would like testing to be sure; Is understandably upset but does not have concerns regarding her safety;     Objective:  Vitals:   12/01/22 1101 12/01/22 1208  BP: (!) 144/92 (!) 144/92  Pulse: 88   Resp: 18   SpO2: 98%   Weight: 268 lb 9.6 oz (121.8 kg)   Height: 5\' 7"  (1.702 m)     General: Well developed, well nourished, in no acute distress  Skin : Warm and dry.  Head: Normocephalic and atraumatic  Lungs: Respirations unlabored;  Neurologic: Alert and oriented; speech intact; face symmetrical; moves all extremities well; CNII-XII intact without focal deficit   Assessment:  1. Screen for STD (sexually transmitted disease)   2. Possible exposure to STD   3. Elevated glucose     Plan:  Will update labs and screening  as requested; follow up to be determined.   No follow-ups on file.  Orders Placed This Encounter  Procedures   HIV antibody (with reflex)   RPR   Comp Met (CMET)   Hemoglobin A1c    Requested Prescriptions    No prescriptions requested or ordered in this encounter

## 2022-12-02 LAB — CERVICOVAGINAL ANCILLARY ONLY
Bacterial Vaginitis (gardnerella): NEGATIVE
Candida Glabrata: NEGATIVE
Candida Vaginitis: NEGATIVE
Chlamydia: NEGATIVE
Comment: NEGATIVE
Comment: NEGATIVE
Comment: NEGATIVE
Comment: NEGATIVE
Comment: NEGATIVE
Comment: NORMAL
Neisseria Gonorrhea: NEGATIVE
Trichomonas: NEGATIVE

## 2022-12-02 LAB — HIV ANTIBODY (ROUTINE TESTING W REFLEX): HIV 1&2 Ab, 4th Generation: NONREACTIVE

## 2022-12-02 LAB — RPR: RPR Ser Ql: NONREACTIVE

## 2022-12-14 ENCOUNTER — Other Ambulatory Visit: Payer: Self-pay

## 2022-12-14 ENCOUNTER — Other Ambulatory Visit (HOSPITAL_BASED_OUTPATIENT_CLINIC_OR_DEPARTMENT_OTHER): Payer: Self-pay

## 2022-12-14 DIAGNOSIS — F900 Attention-deficit hyperactivity disorder, predominantly inattentive type: Secondary | ICD-10-CM | POA: Insufficient documentation

## 2022-12-14 MED ORDER — ATOMOXETINE HCL 60 MG PO CAPS
60.0000 mg | ORAL_CAPSULE | Freq: Every day | ORAL | 2 refills | Status: DC
  Filled 2022-12-14: qty 30, 30d supply, fill #0

## 2022-12-14 MED ORDER — ATOMOXETINE HCL 25 MG PO CAPS
ORAL_CAPSULE | ORAL | 0 refills | Status: DC
  Filled 2022-12-14: qty 21, 14d supply, fill #0

## 2022-12-14 MED ORDER — ESCITALOPRAM OXALATE 20 MG PO TABS
20.0000 mg | ORAL_TABLET | Freq: Every day | ORAL | 0 refills | Status: DC
  Filled 2023-01-02: qty 90, 90d supply, fill #0

## 2022-12-26 ENCOUNTER — Other Ambulatory Visit (HOSPITAL_BASED_OUTPATIENT_CLINIC_OR_DEPARTMENT_OTHER): Payer: Self-pay

## 2023-01-02 ENCOUNTER — Other Ambulatory Visit (HOSPITAL_BASED_OUTPATIENT_CLINIC_OR_DEPARTMENT_OTHER): Payer: Self-pay

## 2023-01-19 ENCOUNTER — Other Ambulatory Visit: Payer: Self-pay

## 2023-01-19 ENCOUNTER — Other Ambulatory Visit (HOSPITAL_BASED_OUTPATIENT_CLINIC_OR_DEPARTMENT_OTHER): Payer: Self-pay

## 2023-01-19 MED ORDER — ESCITALOPRAM OXALATE 20 MG PO TABS
20.0000 mg | ORAL_TABLET | Freq: Every day | ORAL | 0 refills | Status: DC
  Filled 2023-01-19 – 2023-04-05 (×2): qty 90, 90d supply, fill #0

## 2023-01-19 MED ORDER — ATOMOXETINE HCL 80 MG PO CAPS
80.0000 mg | ORAL_CAPSULE | Freq: Every day | ORAL | 3 refills | Status: DC
  Filled 2023-01-19: qty 90, 90d supply, fill #0

## 2023-01-26 ENCOUNTER — Other Ambulatory Visit (HOSPITAL_BASED_OUTPATIENT_CLINIC_OR_DEPARTMENT_OTHER): Payer: Self-pay

## 2023-01-26 ENCOUNTER — Other Ambulatory Visit: Payer: Self-pay | Admitting: Family

## 2023-01-26 MED ORDER — ELIQUIS 5 MG PO TABS
5.0000 mg | ORAL_TABLET | Freq: Two times a day (BID) | ORAL | 3 refills | Status: DC
  Filled 2023-01-26 – 2023-04-05 (×3): qty 180, 90d supply, fill #0
  Filled 2023-08-07: qty 180, 90d supply, fill #1
  Filled 2023-12-04: qty 180, 90d supply, fill #2

## 2023-01-30 ENCOUNTER — Other Ambulatory Visit (HOSPITAL_BASED_OUTPATIENT_CLINIC_OR_DEPARTMENT_OTHER): Payer: Self-pay

## 2023-02-02 ENCOUNTER — Other Ambulatory Visit (HOSPITAL_BASED_OUTPATIENT_CLINIC_OR_DEPARTMENT_OTHER): Payer: Self-pay

## 2023-04-05 ENCOUNTER — Telehealth: Payer: Self-pay

## 2023-04-05 ENCOUNTER — Other Ambulatory Visit (HOSPITAL_BASED_OUTPATIENT_CLINIC_OR_DEPARTMENT_OTHER): Payer: Self-pay

## 2023-04-05 NOTE — Telephone Encounter (Signed)
PA initiated via Covermymeds; KEY: [id number]. Awaiting determination.

## 2023-04-05 NOTE — Telephone Encounter (Signed)
PA approved.   CaseId:[id number];Status:Approved;Review Type:Prior Auth;Coverage Start Date:04/05/2023;Coverage End Date:04/04/2024; Authorization Expiration Date: 04/03/2024

## 2023-04-06 ENCOUNTER — Other Ambulatory Visit (HOSPITAL_BASED_OUTPATIENT_CLINIC_OR_DEPARTMENT_OTHER): Payer: Self-pay

## 2023-04-10 ENCOUNTER — Other Ambulatory Visit (HOSPITAL_BASED_OUTPATIENT_CLINIC_OR_DEPARTMENT_OTHER): Payer: Self-pay

## 2023-04-19 ENCOUNTER — Other Ambulatory Visit: Payer: Self-pay | Admitting: Family

## 2023-04-20 ENCOUNTER — Ambulatory Visit: Payer: Managed Care, Other (non HMO) | Admitting: Family

## 2023-04-20 ENCOUNTER — Other Ambulatory Visit (HOSPITAL_BASED_OUTPATIENT_CLINIC_OR_DEPARTMENT_OTHER): Payer: Self-pay

## 2023-04-20 VITALS — BP 138/86 | HR 77 | Ht 67.0 in | Wt 272.4 lb

## 2023-04-20 DIAGNOSIS — H00019 Hordeolum externum unspecified eye, unspecified eyelid: Secondary | ICD-10-CM

## 2023-04-20 DIAGNOSIS — Z860100 Personal history of colon polyps, unspecified: Secondary | ICD-10-CM

## 2023-04-20 DIAGNOSIS — R7309 Other abnormal glucose: Secondary | ICD-10-CM

## 2023-04-20 DIAGNOSIS — Z1211 Encounter for screening for malignant neoplasm of colon: Secondary | ICD-10-CM | POA: Diagnosis not present

## 2023-04-20 MED ORDER — TOBRAMYCIN 0.3 % OP SOLN
1.0000 [drp] | Freq: Four times a day (QID) | OPHTHALMIC | 0 refills | Status: DC
  Filled 2023-04-20: qty 5, 25d supply, fill #0

## 2023-04-20 MED ORDER — HYDROCHLOROTHIAZIDE 12.5 MG PO TABS
12.5000 mg | ORAL_TABLET | Freq: Every day | ORAL | 3 refills | Status: AC
  Filled 2023-04-20 – 2023-05-04 (×2): qty 90, 90d supply, fill #0
  Filled 2023-07-27: qty 90, 90d supply, fill #1
  Filled 2023-10-28: qty 90, 90d supply, fill #2
  Filled 2024-03-10: qty 90, 90d supply, fill #3

## 2023-04-20 MED ORDER — VALSARTAN 160 MG PO TABS
160.0000 mg | ORAL_TABLET | Freq: Every day | ORAL | 3 refills | Status: AC
  Filled 2023-04-20: qty 90, 90d supply, fill #0
  Filled 2023-07-27: qty 90, 90d supply, fill #1
  Filled 2023-10-28: qty 90, 90d supply, fill #2
  Filled 2024-02-22: qty 90, 90d supply, fill #3

## 2023-04-20 MED ORDER — ROSUVASTATIN CALCIUM 5 MG PO TABS
5.0000 mg | ORAL_TABLET | Freq: Every day | ORAL | 3 refills | Status: AC
  Filled 2023-04-20 – 2023-05-04 (×2): qty 90, 90d supply, fill #0
  Filled 2023-09-23: qty 90, 90d supply, fill #1
  Filled 2024-03-30: qty 90, 90d supply, fill #2

## 2023-04-20 NOTE — Telephone Encounter (Signed)
Appt later today.

## 2023-04-20 NOTE — Progress Notes (Signed)
 Jody Sandoval is a 43 y.o. female with the following history as recorded in EpicCare:  Patient Active Problem List   Diagnosis Date Noted   FH: colon cancer 08/20/2021   Hyperlipidemia 08/20/2021   Right carpal tunnel syndrome 06/30/2021   Panic disorder 03/06/2019   Depression 11/21/2016   Bilateral pulmonary embolism ([facility]) 05/09/2016   Essential hypertension 01/18/2013   ANEMIA-IRON DEFICIENCY 12/18/2009    Current Outpatient Medications  Medication Sig Dispense Refill   atomoxetine  (STRATTERA ) 60 MG capsule Take 1 capsule (60 mg total) by mouth daily. 30 capsule 2   atomoxetine  (STRATTERA ) 80 MG capsule Take 1 capsule (80 mg total) by mouth daily. Swallow capsule whole; do not open. If opened accidentally, do not touch eyes; wash hands immediately (product is an eye irritant). 90 capsule 3   ELIQUIS  5 MG TABS tablet Take 1 tablet (5 mg total) by mouth 2 (two) times daily. 180 tablet 3   escitalopram  (LEXAPRO ) 20 MG tablet Take 1 tablet (20 mg total) by mouth daily. 90 tablet 0   potassium chloride  (MICRO-K ) 10 MEQ CR capsule Take 1 capsule (10 mEq total) by mouth daily. 90 capsule 3   propranolol  (INDERAL ) 10 MG tablet Take 1 tablet (10 mg total) by mouth 2 (two) times daily. 180 tablet 1   tobramycin  (TOBREX ) 0.3 % ophthalmic solution Place 1 drop into the left eye every 6 (six) hours. 5 mL 0   atomoxetine  (STRATTERA ) 25 MG capsule Take 1 capsule daily for a week, then 2 capsules daily for a week (then increase to 60mg  size) 21 capsule 0   hydrochlorothiazide  (HYDRODIURIL ) 12.5 MG tablet Take 1 tablet (12.5 mg total) by mouth daily. 90 tablet 3   rosuvastatin  (CRESTOR ) 5 MG tablet Take 1 tablet (5 mg total) by mouth daily. 90 tablet 3   valsartan  (DIOVAN ) 160 MG tablet Take 1 tablet (160 mg total) by mouth daily. 90 tablet 3   No current facility-administered medications for this visit.    Allergies: Nifedipine, Lisinopril, Tape, and Ferric carboxymaltose  Past Medical History:   Diagnosis Date   DVT (deep venous thrombosis) ([facility]) 12/17/2008   Pulmonary embolism ([facility])     Past Surgical History:  Procedure Laterality Date   ABDOMINAL HYSTERECTOMY  09/03/2020   CESAREAN SECTION  04/18/2002   TONSILLECTOMY      Family History  Problem Relation Age of Onset   Hypertension Mother    Diabetes Mother    Hypertension Father    Heart attack Father     Social History   Tobacco Use   Smoking status: Never   Smokeless tobacco: Not on file  Substance Use Topics   Alcohol use: No    Subjective:  Stye on left lower lid x 3 months; has been applying warm compresses with limited relief;   Overdue for follow up colonoscopy- would like to have order changed to [facility] provider if possible;   Due for follow up labs on blood sugar but defers today- wants to come back at later date;     Objective:  Vitals:   04/20/23 1403  BP: 138/86  Pulse: 77  SpO2: 98%  Weight: 272 lb 6.4 oz (123.6 kg)  Height: 5' 7 (1.702 m)    General: Well developed, well nourished, in no acute distress  Skin : Warm and dry.  Head: Normocephalic and atraumatic  Eyes: Sclera and conjunctiva clear; pupils round and reactive to light; extraocular movements intact; stye noted lower left lid Ears: External normal;  canals clear; tympanic membranes normal  Oropharynx: Pink, supple. No suspicious lesions  Neck: Supple without thyromegaly, adenopathy  Lungs: Respirations unlabored; Neurologic: Alert and oriented; speech intact; face symmetrical; moves all extremities well; CNII-XII intact without focal deficit   Assessment:  1. Hordeolum externum, unspecified laterality   2. Encounter for colonoscopy due to history of colonic polyp   3. Elevated glucose     Plan:  Rx for Tobramycin  Opht Solution- use as directed; will refer to ophthalmology due to length of time symptoms have been present; Refer to GI for further evaluation; Orders updated for labs- she defers today and wants to return at  later date; she will plan to do this in the next month.   Return for lab appointment in the next month.  Orders Placed This Encounter  Procedures   Ambulatory referral to Ophthalmology    Referral Priority:   Routine    Referral Type:   Consultation    Referral Reason:   Specialty Services Required    Requested Specialty:   Ophthalmology    Number of Visits Requested:   1   Ambulatory referral to Gastroenterology    Referral Priority:   Routine    Referral Type:   Consultation    Referral Reason:   Specialty Services Required    Number of Visits Requested:   1    Requested Prescriptions   Signed Prescriptions Disp Refills   tobramycin  (TOBREX ) 0.3 % ophthalmic solution 5 mL 0    Sig: Place 1 drop into the left eye every 6 (six) hours.

## 2023-04-20 NOTE — Patient Instructions (Signed)
 Since your symptoms have been present for so long, I am going to put in a referral to ophthalmology.

## 2023-04-21 ENCOUNTER — Telehealth: Payer: Self-pay | Admitting: Family

## 2023-04-21 ENCOUNTER — Other Ambulatory Visit: Payer: Self-pay | Admitting: Family

## 2023-04-21 DIAGNOSIS — H00015 Hordeolum externum left lower eyelid: Secondary | ICD-10-CM

## 2023-04-21 NOTE — Telephone Encounter (Signed)
 Copied from CRM [id number]. Topic: Referral - Status >> Apr 21, 2023  8:29 AM Orinda Kenner C wrote: Reason for CRM: [facility] from [facility] [phone], does not take Medicaid, patient will need a new referral.

## 2023-04-26 ENCOUNTER — Other Ambulatory Visit (HOSPITAL_BASED_OUTPATIENT_CLINIC_OR_DEPARTMENT_OTHER): Payer: Self-pay

## 2023-05-01 ENCOUNTER — Other Ambulatory Visit (HOSPITAL_BASED_OUTPATIENT_CLINIC_OR_DEPARTMENT_OTHER): Payer: Self-pay

## 2023-05-04 ENCOUNTER — Other Ambulatory Visit (HOSPITAL_BASED_OUTPATIENT_CLINIC_OR_DEPARTMENT_OTHER): Payer: Self-pay

## 2023-05-23 ENCOUNTER — Encounter: Payer: Self-pay | Admitting: Family

## 2023-06-06 ENCOUNTER — Other Ambulatory Visit: Payer: Managed Care, Other (non HMO)

## 2023-06-06 ENCOUNTER — Other Ambulatory Visit (INDEPENDENT_AMBULATORY_CARE_PROVIDER_SITE_OTHER): Payer: Managed Care, Other (non HMO)

## 2023-06-06 DIAGNOSIS — R7309 Other abnormal glucose: Secondary | ICD-10-CM | POA: Diagnosis not present

## 2023-06-06 NOTE — Addendum Note (Signed)
 Addended by: Thelma Barge D on: 06/06/2023 03:21 PM   Modules accepted: Orders

## 2023-06-06 NOTE — Addendum Note (Signed)
 Addended by: Thelma Barge D on: 06/06/2023 03:20 PM   Modules accepted: Orders

## 2023-06-07 ENCOUNTER — Encounter: Payer: Self-pay | Admitting: Family

## 2023-06-07 LAB — CBC WITH DIFFERENTIAL/PLATELET
Absolute Lymphocytes: 1709 {cells}/uL (ref 850–3900)
Absolute Monocytes: 429 {cells}/uL (ref 200–950)
Basophils Absolute: 32 {cells}/uL (ref 0–200)
Basophils Relative: 0.5 %
Eosinophils Absolute: 179 {cells}/uL (ref 15–500)
Eosinophils Relative: 2.8 %
HCT: 41.4 % (ref 35.0–45.0)
Hemoglobin: 13.4 g/dL (ref 11.7–15.5)
MCH: 30.2 pg (ref 27.0–33.0)
MCHC: 32.4 g/dL (ref 32.0–36.0)
MCV: 93.2 fL (ref 80.0–100.0)
MPV: 9.9 fL (ref 7.5–12.5)
Monocytes Relative: 6.7 %
Neutro Abs: 4051 {cells}/uL (ref 1500–7800)
Neutrophils Relative %: 63.3 %
Platelets: 231 10*3/uL (ref 140–400)
RBC: 4.44 10*6/uL (ref 3.80–5.10)
RDW: 11.9 % (ref 11.0–15.0)
Total Lymphocyte: 26.7 %
WBC: 6.4 10*3/uL (ref 3.8–10.8)

## 2023-06-07 LAB — COMPREHENSIVE METABOLIC PANEL
AG Ratio: 1.7 (calc) (ref 1.0–2.5)
ALT: 19 U/L (ref 6–29)
AST: 16 U/L (ref 10–35)
Albumin: 4.3 g/dL (ref 3.6–5.1)
Alkaline phosphatase (APISO): 60 U/L (ref 31–125)
BUN: 12 mg/dL (ref 7–25)
CO2: 28 mmol/L (ref 20–32)
Calcium: 9.6 mg/dL (ref 8.6–10.2)
Chloride: 104 mmol/L (ref 98–110)
Creat: 0.72 mg/dL (ref 0.50–0.99)
Globulin: 2.5 g/dL (ref 1.9–3.7)
Glucose, Bld: 84 mg/dL (ref 65–99)
Potassium: 4 mmol/L (ref 3.5–5.3)
Sodium: 140 mmol/L (ref 135–146)
Total Bilirubin: 0.4 mg/dL (ref 0.2–1.2)
Total Protein: 6.8 g/dL (ref 6.1–8.1)

## 2023-06-07 LAB — HEMOGLOBIN A1C
Hgb A1c MFr Bld: 6.7 %{Hb} — ABNORMAL HIGH (ref <5.7)
Mean Plasma Glucose: 146 mg/dL
eAG (mmol/L): 8.1 mmol/L

## 2023-06-08 ENCOUNTER — Telehealth: Payer: Self-pay | Admitting: Gastroenterology

## 2023-06-08 NOTE — Telephone Encounter (Signed)
 Good afternoon Dr. Barron Alvine,   Supervising MD for today 2/20 PM     Patient called stating that our practice had received a referral from  her PCP Burnell Blanks for a colonoscopy. Patient states she had a colonoscopy in May of 2023 with [facility]. Patient states she was due in 2024 and is in need of one because her sister passed away from colon cancer in 2021. Patients records are in epic will you please review and advise on scheduling patient for colonoscopy.  Please advise.

## 2023-06-14 NOTE — Telephone Encounter (Signed)
Lvm for patient to call back to schedule OV. 

## 2023-06-15 ENCOUNTER — Encounter: Payer: Self-pay | Admitting: Physician Assistant

## 2023-06-16 ENCOUNTER — Other Ambulatory Visit (HOSPITAL_BASED_OUTPATIENT_CLINIC_OR_DEPARTMENT_OTHER): Payer: Self-pay

## 2023-06-16 ENCOUNTER — Other Ambulatory Visit: Payer: Self-pay

## 2023-06-16 MED ORDER — ESCITALOPRAM OXALATE 20 MG PO TABS
20.0000 mg | ORAL_TABLET | Freq: Every day | ORAL | 0 refills | Status: AC
  Filled 2023-06-16 – 2023-06-20 (×2): qty 90, 90d supply, fill #0

## 2023-06-16 MED ORDER — ATOMOXETINE HCL 25 MG PO CAPS
ORAL_CAPSULE | ORAL | 0 refills | Status: DC
Start: 2023-06-16 — End: 2023-07-26
  Filled 2023-06-16: qty 21, 14d supply, fill #0

## 2023-06-20 ENCOUNTER — Other Ambulatory Visit (HOSPITAL_BASED_OUTPATIENT_CLINIC_OR_DEPARTMENT_OTHER): Payer: Self-pay

## 2023-07-07 ENCOUNTER — Other Ambulatory Visit (HOSPITAL_BASED_OUTPATIENT_CLINIC_OR_DEPARTMENT_OTHER): Payer: Self-pay

## 2023-07-07 MED ORDER — ATOMOXETINE HCL 60 MG PO CAPS
60.0000 mg | ORAL_CAPSULE | Freq: Every day | ORAL | 2 refills | Status: DC
Start: 2023-07-07 — End: 2024-02-15
  Filled 2023-07-07: qty 90, 90d supply, fill #0

## 2023-07-26 ENCOUNTER — Ambulatory Visit: Payer: Managed Care, Other (non HMO) | Admitting: Physician Assistant

## 2023-07-26 ENCOUNTER — Other Ambulatory Visit (HOSPITAL_BASED_OUTPATIENT_CLINIC_OR_DEPARTMENT_OTHER): Payer: Self-pay

## 2023-07-26 ENCOUNTER — Encounter: Payer: Self-pay | Admitting: Physician Assistant

## 2023-07-26 DIAGNOSIS — Z860101 Personal history of adenomatous and serrated colon polyps: Secondary | ICD-10-CM | POA: Diagnosis not present

## 2023-07-26 DIAGNOSIS — Z6841 Body Mass Index (BMI) 40.0 and over, adult: Secondary | ICD-10-CM | POA: Diagnosis not present

## 2023-07-26 DIAGNOSIS — I2699 Other pulmonary embolism without acute cor pulmonale: Secondary | ICD-10-CM

## 2023-07-26 DIAGNOSIS — Z86711 Personal history of pulmonary embolism: Secondary | ICD-10-CM

## 2023-07-26 DIAGNOSIS — Z86718 Personal history of other venous thrombosis and embolism: Secondary | ICD-10-CM

## 2023-07-26 DIAGNOSIS — Z800 Family history of malignant neoplasm of digestive organs: Secondary | ICD-10-CM

## 2023-07-26 MED ORDER — SUTAB 1479-225-188 MG PO TABS
24.0000 | ORAL_TABLET | ORAL | 0 refills | Status: DC
  Filled 2023-07-26: qty 24, 2d supply, fill #0
  Filled 2023-07-26: qty 24, 1d supply, fill #0

## 2023-07-26 NOTE — Progress Notes (Signed)
 07/26/2023 Jody Sandoval [id number] Sep 20, 1980  Referring provider: Olive Bass,* Primary GI doctor: Dr. Barron Alvine  ASSESSMENT AND PLAN:   Personal history of colon polyps/family history of colon cancer Sister with colon cancer at age 45, dad with prostate cancer No changes in bowel habits  Colonoscopy in 08/2021 notable for 7 mm semipedunculated adenoma in the ascending colon removed en bloc by cold snare, along with medium internal hemorrhoids.  Due to inadequate bowel prep, recommended colonoscopy was in 1 year.    Will plan on repeat colonoscopy at [facility] off eliquis for 2 days after permission with a two day prep We have discussed the risks of bleeding, infection, perforation, medication reactions, and remote risk of death associated with colonoscopy. All questions were answered and the patient acknowledges these risk and wishes to proceed.  History of bilateral PE  2018 during pregnancy had PE, then had DVT 2020 Negative genetic work up On eliquis Hold Eliquis for 2 days before procedure will instruct when and how to resume after procedure.  Patient understands that there is a low but real risk of cardiovascular event such as heart attack, stroke, or embolism /  thrombosis, or ischemia while off Eliquis  The patient consents to proceed.  Will communicate by phone or EMR with patient's prescribing provider to confirm that holding Eliquis is reasonable in this case.   IDA 06/06/2023  HGB 13.4 MCV 93.2 Platelets 231 Recent Labs    08/30/22 1036 06/06/23 1521  HGB 13.1 13.4  No recent anemia  Morbid obesity  Body mass index is 42.6 kg/m.  -Patient has been advised to make an attempt to improve diet and exercise patterns to aid in weight loss. -Recommended diet heavy in fruits and veggies and low in animal meats, cheeses, and dairy products, appropriate calorie intake  Patient Care Team: Olive Bass, FNP as PCP - General (Internal Medicine) Zola Button, Grayling Congress, DO  HISTORY OF PRESENT ILLNESS: 43 y.o. female with a past medical history listed below presents for evaluation of colon polyps and family history of colon cancer.   Discussed the use of AI scribe software for clinical note transcription with the patient, who gave verbal consent to proceed.  History of Present Illness   Jody Sandoval "Ty" is a 43 year old female who presents for a colonoscopy screening due to family history of colon cancer.  She is undergoing a colonoscopy screening due to a significant family history of colon cancer. Her older sister passed away from colon cancer at the age of 50. She has five other siblings with no history of colon cancer. Her parents do not have a history of colon cancer, but her father has a history of prostate cancer, hypertension, and diabetes.  No changes in bowel habits, heartburn, nausea, vomiting, or trouble swallowing. She has experienced constipation, which improved with probiotics, resulting in regular bowel movements once a day. She noted bright red blood on toilet paper about a month ago, without associated pain. A colonoscopy in 2023 revealed a 7mm semi-pedunculated adenoma, but the bowel preparation was not optimal.  She has a history of pulmonary embolism and deep vein thrombosis. The first pulmonary embolism occurred in 2018 during pregnancy, leading to syncope at home. Another clot in her leg occurred in 2020. She has been on Eliquis since then and has not tested positive for any genetic clotting disorders. There is no family history of blood clots.        She  reports that she has never smoked. She does not have any smokeless tobacco history on file. She reports that she does not drink alcohol and does not use drugs.  RELEVANT GI HISTORY, IMAGING AND LABS: Results   DIAGNOSTIC Colonoscopy: 7 mm semi-pedunculated adenoma (2023)      CBC    Component Value Date/Time   WBC 6.4 06/06/2023 1521   RBC 4.44 06/06/2023  1521   HGB 13.4 06/06/2023 1521   HCT 41.4 06/06/2023 1521   PLT 231 06/06/2023 1521   MCV 93.2 06/06/2023 1521   MCH 30.2 06/06/2023 1521   MCHC 32.4 06/06/2023 1521   RDW 11.9 06/06/2023 1521   LYMPHSABS 1.7 08/30/2022 1036   MONOABS 0.4 08/30/2022 1036   EOSABS 179 06/06/2023 1521   BASOSABS 32 06/06/2023 1521   Recent Labs    08/30/22 1036 06/06/23 1521  HGB 13.1 13.4    CMP     Component Value Date/Time   NA 140 06/06/2023 1521   K 4.0 06/06/2023 1521   CL 104 06/06/2023 1521   CO2 28 06/06/2023 1521   GLUCOSE 84 06/06/2023 1521   BUN 12 06/06/2023 1521   CREATININE 0.72 06/06/2023 1521   CALCIUM 9.6 06/06/2023 1521   PROT 6.8 06/06/2023 1521   ALBUMIN 4.3 12/01/2022 1129   AST 16 06/06/2023 1521   ALT 19 06/06/2023 1521   ALKPHOS 55 12/01/2022 1129   BILITOT 0.4 06/06/2023 1521   GFRNONAA 96.88 12/18/2009 1033      Latest Ref Rng & Units 06/06/2023    3:21 PM 12/01/2022   11:29 AM 08/25/2022    2:09 PM  Hepatic Function  Total Protein 6.1 - 8.1 g/dL 6.8  6.9  6.7   Albumin 3.5 - 5.2 g/dL  4.3  4.0   AST 10 - 35 U/L 16  19  22    ALT 6 - 29 U/L 19  23  21    Alk Phosphatase 39 - 117 U/L  55  62   Total Bilirubin 0.2 - 1.2 mg/dL 0.4  0.4  0.4       Current Medications:    Current Outpatient Medications (Cardiovascular):    hydrochlorothiazide (HYDRODIURIL) 12.5 MG tablet, Take 1 tablet (12.5 mg total) by mouth daily.   propranolol (INDERAL) 10 MG tablet, Take 1 tablet (10 mg total) by mouth 2 (two) times daily.   rosuvastatin (CRESTOR) 5 MG tablet, Take 1 tablet (5 mg total) by mouth daily.   valsartan (DIOVAN) 160 MG tablet, Take 1 tablet (160 mg total) by mouth daily.    Current Outpatient Medications (Hematological):    ELIQUIS 5 MG TABS tablet, Take 1 tablet (5 mg total) by mouth 2 (two) times daily.  Current Outpatient Medications (Other):    atomoxetine (STRATTERA) 60 MG capsule, Take 1 capsule (60 mg total) by mouth daily.   escitalopram  (LEXAPRO) 20 MG tablet, Take one tablet daily   potassium chloride (MICRO-K) 10 MEQ CR capsule, Take 1 capsule (10 mEq total) by mouth daily.   Sodium Sulfate-Mag Sulfate-KCl (SUTAB) [phone] MG TABS, Use as directed for colonoscopy. MANUFACTURER CODES!! BIN: [id number] PCN: CN GROUP: [id number] MEMBER ID: [id number];LKG AS SECONDARY INSURANCE ;NO PRIOR AUTHORIZATION  Medical History:  Past Medical History:  Diagnosis Date   DVT (deep venous thrombosis) (HCC) 12/17/2008   Pulmonary embolism (HCC)    Allergies:  Allergies  Allergen Reactions   Nifedipine Swelling   Lisinopril Other (See Comments)    Cough Cough    Tape Itching  Blue tape   Ferric Carboxymaltose Itching     Surgical History:  She  has a past surgical history that includes Cesarean section (04/18/2002); Tonsillectomy; and Abdominal hysterectomy (09/03/2020). Family History:  Her family history includes Colon cancer in her sister; Diabetes in her mother; Heart attack in her father; Hypertension in her father and mother.  REVIEW OF SYSTEMS  : All other systems reviewed and negative except where noted in the History of Present Illness.  PHYSICAL EXAM: BP (!) 140/88   Pulse 97   Ht 5\' 7"  (1.702 m)   Wt 272 lb (123.4 kg)   LMP 01/24/2011   SpO2 100%   BMI 42.60 kg/m  Physical Exam   GENERAL APPEARANCE: Well nourished, in no apparent distress. HEENT: No cervical lymphadenopathy, unremarkable thyroid, sclerae anicteric, conjunctiva pink. RESPIRATORY: Respiratory effort normal, breath sounds clear to auscultation bilaterally without rales, rhonchi, or wheezing. CARDIO: Regular rate and rhythm with no murmurs, rubs, or gallops, peripheral pulses intact. ABDOMEN: Abdomen soft, non-distended, active bowel sounds in all four quadrants, no tenderness to palpation, no rebound, no mass appreciated. RECTAL: Declines. MUSCULOSKELETAL: Full range of motion, normal gait, without edema. SKIN: Dry, intact without rashes or  lesions. No jaundice. NEURO: Alert, oriented, no focal deficits. PSYCH: Cooperative, normal mood and affect.      Doree Albee, PA-C 3:15 PM

## 2023-07-26 NOTE — Patient Instructions (Addendum)
 _______________________________________________________  If your blood pressure at your visit was 140/90 or greater, please contact your primary care physician to follow up on this.  If you are age 45 or younger, your body mass index should be between 19-25. Your Body mass index is 42.6 kg/m. If this is out of the aformentioned range listed, please consider follow up with your Primary Care Provider.  ________________________________________________________  The [facility] providers would like to encourage you to use [facility] to communicate with providers for non-urgent requests or questions.  Due to long hold times on the telephone, sending your provider a message by [facility] may be a faster and more efficient way to get a response.  Please allow 48 business hours for a response.  Please remember that this is for non-urgent requests.  _______________________________________________________  Bonita Quin have been scheduled for a colonoscopy. Please follow written instructions given to you at your visit today.   If you use inhalers (even only as needed), please bring them with you on the day of your procedure.  DO NOT TAKE 7 DAYS PRIOR TO TEST- Trulicity (dulaglutide) Ozempic, Wegovy (semaglutide) Mounjaro (tirzepatide) Bydureon Bcise (exanatide extended release)  DO NOT TAKE 1 DAY PRIOR TO YOUR TEST Rybelsus (semaglutide) Adlyxin (lixisenatide) Victoza (liraglutide) Byetta (exanatide) ___________________________________________________________________________  Due to recent changes in healthcare laws, you may see the results of your imaging and laboratory studies on MyChart before your provider has had a chance to review them.  We understand that in some cases there may be results that are confusing or concerning to you. Not all laboratory results come back in the same time frame and the provider may be waiting for multiple results in order to interpret others.  Please give [place] 48 hours in order for  your provider to thoroughly review all the results before contacting the office for clarification of your results.   Miralax is an osmotic laxative.  It only brings more water into the stool.  This is safe to take daily.  Can take up to 17 gram of miralax twice a day.  Mix with juice or coffee.  Start 1 capful at night for 3-4 days and reassess your response in 3-4 days.  You can increase and decrease the dose based on your response.  Remember, it can take up to 3-4 days to take effect OR for the effects to wear off.   I often pair this with benefiber in the morning to help assure the stool is not too loose.   Toileting tips to help with your constipation - Drink at least 64-80 ounces of water/liquid per day. - Establish a time to try to move your bowels every day.  For many people, this is after a cup of coffee or after a meal such as breakfast. - Sit all of the way back on the toilet keeping your back fairly straight and while sitting up, try to rest the tops of your forearms on your upper thighs.   - Raising your feet with a step stool/squatty potty can be helpful to improve the angle that allows your stool to pass through the rectum. - Relax the rectum feeling it bulge toward the toilet water.  If you feel your rectum raising toward your body, you are contracting rather than relaxing. - Breathe in and slowly exhale. "Belly breath" by expanding your belly towards your belly button. Keep belly expanded as you gently direct pressure down and back to the anus.  A low pitched GRRR sound can assist with increasing  intra-abdominal pressure.  (Can also trying to blow on a pinwheel and make it move, this helps with the same belly breathing) - Repeat 3-4 times. If unsuccessful, contract the pelvic floor to restore normal tone and get off the toilet.  Avoid excessive straining. - To reduce excessive wiping by teaching your anus to normally contract, place hands on outer aspect of knees and resist knee  movement outward.  Hold 5-10 second then place hands just inside of knees and resist inward movement of knees.  Hold 5 seconds.  Repeat a few times each way.  Go to the ER if unable to pass gas, severe AB pain, unable to hold down food, any shortness of breath of chest pain.  Thank you for entrusting me with your care and choosing [facility].  Quentin Mulling, PA-C

## 2023-07-27 ENCOUNTER — Other Ambulatory Visit (HOSPITAL_BASED_OUTPATIENT_CLINIC_OR_DEPARTMENT_OTHER): Payer: Self-pay

## 2023-07-30 NOTE — Progress Notes (Signed)
 Agree with the assessment and plan as outlined by Quentin Mulling, PA-C. ? ?Keron Neenan, DO, FACG ? ?

## 2023-08-01 ENCOUNTER — Other Ambulatory Visit (HOSPITAL_BASED_OUTPATIENT_CLINIC_OR_DEPARTMENT_OTHER): Payer: Self-pay

## 2023-08-29 ENCOUNTER — Telehealth: Payer: Self-pay | Admitting: Gastroenterology

## 2023-08-29 NOTE — Telephone Encounter (Signed)
 Patient requesting to f/u in regurds to prep.

## 2023-08-30 ENCOUNTER — Encounter: Payer: Self-pay | Admitting: Gastroenterology

## 2023-08-30 ENCOUNTER — Telehealth: Payer: Self-pay

## 2023-08-30 ENCOUNTER — Other Ambulatory Visit (HOSPITAL_BASED_OUTPATIENT_CLINIC_OR_DEPARTMENT_OTHER): Payer: Self-pay

## 2023-08-30 MED ORDER — NA SULFATE-K SULFATE-MG SULF 17.5-3.13-1.6 GM/177ML PO SOLN
1.0000 | ORAL | 0 refills | Status: DC
  Filled 2023-08-30: qty 354, 1d supply, fill #0

## 2023-08-30 NOTE — Telephone Encounter (Signed)
 Left message for patient to return call to office to further discuss results.

## 2023-08-30 NOTE — Telephone Encounter (Signed)
  Jody Sandoval [birth year] [id number]  08-30-23   Dear [name],  We have scheduled the above named patient for a colonoscopy procedure. Our records show that she is on anticoagulation therapy.  Please advise as to whether the patient may come off their therapy of Eliquis  2 days prior to their procedure which is scheduled for 09-08-23.  Please route your response to Angelene Barbone, CMA or fax response to [phone].  Sincerely,    [facility]

## 2023-08-30 NOTE — Telephone Encounter (Signed)
 Patient returned call to the office and stated she would like to change her bowel prep from SuTab  to Suprep.  Patient advised that Suprep will be sent to pharmacy.  New instructions will be sent to patient via MyChart.  Patient agreed to plan and verbalized understanding.  No further questions.

## 2023-08-31 NOTE — Telephone Encounter (Signed)
 Patient returning phone call. Requesting call back. Please advise, thank you.

## 2023-08-31 NOTE — Telephone Encounter (Signed)
 Left message for patient to return call to further discuss OK to hold Eliquis  prior to procedure.  Will continue efforts.

## 2023-08-31 NOTE — Telephone Encounter (Signed)
 Patient advised that she has been given clearance to hold Eliquis  2 days prior to colonoscopy scheduled for 09-08-23.  Patient advised to take last dose of Eliquis  on 09-05-23, and she will be advised when to restart Eliquis  by Dr Karene Oto after the procedure.  Patient agreed to plan and verbalized understanding.  No further questions.

## 2023-09-01 ENCOUNTER — Encounter: Payer: Self-pay | Admitting: Family

## 2023-09-04 ENCOUNTER — Other Ambulatory Visit: Payer: Self-pay | Admitting: Family

## 2023-09-04 ENCOUNTER — Encounter (INDEPENDENT_AMBULATORY_CARE_PROVIDER_SITE_OTHER): Payer: Self-pay

## 2023-09-04 DIAGNOSIS — I10 Essential (primary) hypertension: Secondary | ICD-10-CM

## 2023-09-04 DIAGNOSIS — E119 Type 2 diabetes mellitus without complications: Secondary | ICD-10-CM

## 2023-09-06 ENCOUNTER — Other Ambulatory Visit (HOSPITAL_BASED_OUTPATIENT_CLINIC_OR_DEPARTMENT_OTHER): Payer: Self-pay

## 2023-09-08 ENCOUNTER — Ambulatory Visit (AMBULATORY_SURGERY_CENTER): Admitting: Gastroenterology

## 2023-09-08 ENCOUNTER — Encounter: Payer: Self-pay | Admitting: Gastroenterology

## 2023-09-08 VITALS — BP 132/89 | HR 91 | Temp 97.3°F | Resp 11 | Ht 67.0 in | Wt 272.0 lb

## 2023-09-08 DIAGNOSIS — K635 Polyp of colon: Secondary | ICD-10-CM | POA: Diagnosis not present

## 2023-09-08 DIAGNOSIS — Z860101 Personal history of adenomatous and serrated colon polyps: Secondary | ICD-10-CM

## 2023-09-08 DIAGNOSIS — K621 Rectal polyp: Secondary | ICD-10-CM

## 2023-09-08 DIAGNOSIS — K644 Residual hemorrhoidal skin tags: Secondary | ICD-10-CM

## 2023-09-08 DIAGNOSIS — K562 Volvulus: Secondary | ICD-10-CM

## 2023-09-08 DIAGNOSIS — D128 Benign neoplasm of rectum: Secondary | ICD-10-CM

## 2023-09-08 DIAGNOSIS — D125 Benign neoplasm of sigmoid colon: Secondary | ICD-10-CM

## 2023-09-08 DIAGNOSIS — Z1211 Encounter for screening for malignant neoplasm of colon: Secondary | ICD-10-CM

## 2023-09-08 DIAGNOSIS — Z800 Family history of malignant neoplasm of digestive organs: Secondary | ICD-10-CM

## 2023-09-08 MED ORDER — SODIUM CHLORIDE 0.9 % IV SOLN
500.0000 mL | Freq: Once | INTRAVENOUS | Status: DC
Start: 2023-09-08 — End: 2023-09-08

## 2023-09-08 NOTE — Patient Instructions (Signed)
 Resume previous diet and medications - resume Eliquis  at prior dose tomorrow, 5/24.  Handout provided on polyps.  Repeat colonoscopy based on biopsy results.    YOU HAD AN ENDOSCOPIC PROCEDURE TODAY AT THE [facility]:   Refer to the procedure report that was given to you for any specific questions about what was found during the examination.  If the procedure report does not answer your questions, please call your gastroenterologist to clarify.  If you requested that your care partner not be given the details of your procedure findings, then the procedure report has been included in a sealed envelope for you to review at your convenience later.  YOU SHOULD EXPECT: Some feelings of bloating in the abdomen. Passage of more gas than usual.  Walking can help get rid of the air that was put into your GI tract during the procedure and reduce the bloating. If you had a lower endoscopy (such as a colonoscopy or flexible sigmoidoscopy) you may notice spotting of blood in your stool or on the toilet paper. If you underwent a bowel prep for your procedure, you may not have a normal bowel movement for a few days.  Please Note:  You might notice some irritation and congestion in your nose or some drainage.  This is from the oxygen used during your procedure.  There is no need for concern and it should clear up in a day or so.  SYMPTOMS TO REPORT IMMEDIATELY:  Following lower endoscopy (colonoscopy or flexible sigmoidoscopy):  Excessive amounts of blood in the stool  Significant tenderness or worsening of abdominal pains  Swelling of the abdomen that is new, acute  Fever of 100F or higher  For urgent or emergent issues, a gastroenterologist can be reached at any hour by calling [phone]. Do not use MyChart messaging for urgent concerns.    DIET:  We do recommend a small meal at first, but then you may proceed to your regular diet.  Drink plenty of fluids but you should avoid alcoholic  beverages for 24 hours.  ACTIVITY:  You should plan to take it easy for the rest of today and you should NOT DRIVE or use heavy machinery until tomorrow (because of the sedation medicines used during the test).    FOLLOW UP: Our staff will call the number listed on your records the next business day following your procedure.  We will call around 7:15- 8:00 am to check on you and address any questions or concerns that you may have regarding the information given to you following your procedure. If we do not reach you, we will leave a message.     If any biopsies were taken you will be contacted by phone or by letter within the next 1-3 weeks.  Please call us  at [phone] if you have not heard about the biopsies in 3 weeks.    SIGNATURES/CONFIDENTIALITY: You and/or your care partner have signed paperwork which will be entered into your electronic medical record.  These signatures attest to the fact that that the information above on your After Visit Summary has been reviewed and is understood.  Full responsibility of the confidentiality of this discharge information lies with you and/or your care-partner.

## 2023-09-08 NOTE — Progress Notes (Signed)
 Called to room to assist during endoscopic procedure.  Patient ID and intended procedure confirmed with present staff. Received instructions for my participation in the procedure from the performing physician.

## 2023-09-08 NOTE — Progress Notes (Signed)
 Sedate, gd SR, tolerated procedure well, VSS, report to RN

## 2023-09-08 NOTE — Progress Notes (Signed)
 GASTROENTEROLOGY PROCEDURE H&P NOTE   Primary Care Physician: Adra Alanis, FNP    Reason for Procedure:  Family history of colon cancer, personal history of colon polyps  Plan:    Colonoscopy  Patient is appropriate for endoscopic procedure(s) in the ambulatory ([facility]) setting.  The nature of the procedure, as well as the risks, benefits, and alternatives were carefully and thoroughly reviewed with the patient. Ample time for discussion and questions allowed. The patient understood, was satisfied, and agreed to proceed.     HPI: Jody Sandoval is a 43 y.o. female who presents for colonoscopy for ongoing surveillance.  Colonoscopy in 08/2021 notable for 7 mm semipedunculated adenoma in the ascending colon removed en bloc by cold snare, along with medium internal hemorrhoids.  Due to inadequate bowel prep, recommended colonoscopy was in 1 year.     Family history notable for sister with colon cancer age 59.  Has been holding Eliquis  for 2 days for procedure today.  Otherwise, no GI symptoms.  Past Medical History:  Diagnosis Date   Anemia    Anxiety    Depression    DVT (deep venous thrombosis) (HCC) 12/17/2008   Hypertension    Pulmonary embolism [facility])     Past Surgical History:  Procedure Laterality Date   ABDOMINAL HYSTERECTOMY  09/03/2020   CESAREAN SECTION  09/15/2002   HERNIA REPAIR     same time as hysterectomy   TONSILLECTOMY      Prior to Admission medications   Medication Sig Start Date End Date Taking? Authorizing Provider  atomoxetine  (STRATTERA ) 60 MG capsule Take 1 capsule (60 mg total) by mouth daily. 07/07/23  Yes   escitalopram  (LEXAPRO ) 20 MG tablet Take one tablet daily 06/16/23  Yes   hydrochlorothiazide  (HYDRODIURIL ) 12.5 MG tablet Take 1 tablet (12.5 mg total) by mouth daily. 04/20/23  Yes Adra Alanis, FNP  potassium chloride  (MICRO-K ) 10 MEQ CR capsule Take 1 capsule (10 mEq total) by mouth daily. 02/25/22  Yes Adra Alanis, FNP  propranolol  (INDERAL ) 10 MG tablet Take 1 tablet (10 mg total) by mouth 2 (two) times daily. 08/25/22  Yes Adra Alanis, FNP  rosuvastatin  (CRESTOR ) 5 MG tablet Take 1 tablet (5 mg total) by mouth daily. 04/20/23  Yes Adra Alanis, FNP  valsartan  (DIOVAN ) 160 MG tablet Take 1 tablet (160 mg total) by mouth daily. 04/20/23  Yes Adra Alanis, FNP  ELIQUIS  5 MG TABS tablet Take 1 tablet (5 mg total) by mouth 2 (two) times daily. 01/26/23   Adra Alanis, FNP    Current Outpatient Medications  Medication Sig Dispense Refill   atomoxetine  (STRATTERA ) 60 MG capsule Take 1 capsule (60 mg total) by mouth daily. 30 capsule 2   escitalopram  (LEXAPRO ) 20 MG tablet Take one tablet daily 90 tablet 0   hydrochlorothiazide  (HYDRODIURIL ) 12.5 MG tablet Take 1 tablet (12.5 mg total) by mouth daily. 90 tablet 3   potassium chloride  (MICRO-K ) 10 MEQ CR capsule Take 1 capsule (10 mEq total) by mouth daily. 90 capsule 3   propranolol  (INDERAL ) 10 MG tablet Take 1 tablet (10 mg total) by mouth 2 (two) times daily. 180 tablet 1   rosuvastatin  (CRESTOR ) 5 MG tablet Take 1 tablet (5 mg total) by mouth daily. 90 tablet 3   valsartan  (DIOVAN ) 160 MG tablet Take 1 tablet (160 mg total) by mouth daily. 90 tablet 3   ELIQUIS  5 MG TABS tablet Take 1 tablet (5 mg total) by  mouth 2 (two) times daily. 180 tablet 3   Current Facility-Administered Medications  Medication Dose Route Frequency Provider Last Rate Last Admin   0.9 %  sodium chloride infusion  500 mL Intravenous Once Natori Gudino V, DO        Allergies as of 09/08/2023 - Review Complete 09/08/2023  Allergen Reaction Noted   Nifedipine Swelling 01/22/2014   Ferric carboxymaltose Itching 11/13/2019   Lisinopril Other (See Comments) 09/27/2016   Tape Itching 09/23/2020    Family History  Problem Relation Age of Onset   Hypertension Mother    Diabetes Mother    Hypertension Father    Heart attack Father     Colon cancer Sister    Stomach cancer Neg Hx    Esophageal cancer Neg Hx     Social History   Socioeconomic History   Marital status: Significant Other    Spouse name: Not on file   Number of children: 3   Years of education: college   Highest education level: Bachelor's degree (e.g., BA, AB, BS)  Occupational History   Occupation: helps prenatal mothers w/ resources  Tobacco Use   Smoking status: Never   Smokeless tobacco: Not on file  Substance and Sexual Activity   Alcohol use: No   Drug use: No   Sexual activity: Yes    Birth control/protection: Other-see comments  Other Topics Concern   Not on file  Social History Narrative   Lives at home children and their father.   Right-handed.   Caffeine use: 95 mg per day (energy shot).   Social Drivers of Corporate investment banker Strain: Low Risk  (04/20/2023)   Overall Financial Resource Strain (CARDIA)    Difficulty of Paying Living Expenses: Not very hard  Food Insecurity: No Food Insecurity (04/20/2023)   Hunger Vital Sign    Worried About Running Out of Food in the Last Year: Never true    Ran Out of Food in the Last Year: Never true  Transportation Needs: No Transportation Needs (04/20/2023)   PRAPARE - Administrator, Civil Service (Medical): No    Lack of Transportation (Non-Medical): No  Physical Activity: Unknown (04/20/2023)   Exercise Vital Sign    Days of Exercise per Week: 0 days    Minutes of Exercise per Session: Not on file  Stress: Stress Concern Present (04/20/2023)   [employer] of Occupational Health - Occupational Stress Questionnaire    Feeling of Stress : To some extent  Social Connections: Moderately Isolated (04/20/2023)   Social Connection and Isolation Panel [NHANES]    Frequency of Communication with Friends and Family: More than three times a week    Frequency of Social Gatherings with Friends and Family: Once a week    Attends Religious Services: 1 to 4 times per year    Active  Member of [employer] or Organizations: No    Attends Engineer, structural: Not on file    Marital Status: Separated  Intimate Partner Violence: Not on file    Physical Exam: Vital signs in last 24 hours: @BP  (!) 152/105   Pulse 91   Temp (!) 97.3 F (36.3 C)   Ht 5\' 7"  (1.702 m)   Wt 272 lb (123.4 kg)   LMP 01/24/2011   SpO2 99%   BMI 42.60 kg/m  GEN: NAD EYE: Sclerae anicteric ENT: MMM CV: Non-tachycardic Pulm: CTA b/l GI: Soft, NT/ND NEURO:  Alert & Oriented x 3   Carlisle Enke, DO  [facility]   09/08/2023 3:13 PM

## 2023-09-08 NOTE — Op Note (Addendum)
 [facility] Patient Name: Jody Sandoval Procedure Date: 09/08/2023 3:04 PM MRN: [id number] Endoscopist: Harry Lindau , MD, [id number] Age: 43 Referring MD:  Date of Birth: August 01, 1980 Gender: Female Account #: [id number] Procedure:                Colonoscopy Indications:              Screening in patient at increased risk: Colorectal                            cancer in sister before age 15. Personal history of                            colon polyps.                           Colonoscopy in 08/2021 notable for 7 mm                            semipedunculated adenoma in the ascending colon                            removed en bloc by cold snare, along with medium                            internal hemorrhoids. Due to inadequate bowel prep,                            recommended colonoscopy was in 1 year.                           Family history notable for sister with colon cancer                            age 65. Medicines:                Monitored Anesthesia Care Procedure:                Pre-Anesthesia Assessment:                           - Prior to the procedure, a History and Physical                            was performed, and patient medications and                            allergies were reviewed. The patient's tolerance of                            previous anesthesia was also reviewed. The risks                            and benefits of the procedure and the sedation  options and risks were discussed with the patient.                            All questions were answered, and informed consent                            was obtained. Prior Anticoagulants: The patient has                            taken Eliquis  (apixaban ), last dose was 2 days                            prior to procedure. ASA Grade Assessment: III - A                            patient with severe systemic disease. After                            reviewing the  risks and benefits, the patient was                            deemed in satisfactory condition to undergo the                            procedure.                           After obtaining informed consent, the colonoscope                            was passed under direct vision. Throughout the                            procedure, the patient's blood pressure, pulse, and                            oxygen saturations were monitored continuously. The                            CF [id number] #[id number] was introduced through the anus                            and advanced to the the cecum, identified by                            appendiceal orifice and ileocecal valve. The                            colonoscopy was technically difficult and complex                            due to significant looping. The patient tolerated  the procedure well. The quality of the bowel                            preparation was good. The ileocecal valve,                            appendiceal orifice, and rectum were photographed. Scope In: 3:21:11 PM Scope Out: 3:49:51 PM Scope Withdrawal Time: 0 hours 14 minutes 5 seconds  Total Procedure Duration: 0 hours 28 minutes 40 seconds  Findings:                 Skin tags were found on perianal exam.                           A 3 mm polyp was found in the sigmoid colon. The                            polyp was sessile. The polyp was removed with a                            cold snare. Resection and retrieval were complete.                            Estimated blood loss was minimal.                           Three sessile polyps were found in the rectum. The                            polyps were 1 to 3 mm in size. These polyps were                            removed with a cold snare. Resection and retrieval                            were complete. Estimated blood loss was minimal.                           The hepatic flexure and  ascending colon revealed                            significantly excessive looping. Advancing the                            scope required using manual pressure and                            straightening and shortening the scope to obtain                            bowel loop reduction.                           The retroflexed view of  the distal rectum and anal                            verge was normal and showed no anal or rectal                            abnormalities. Complications:            No immediate complications. Estimated Blood Loss:     Estimated blood loss was minimal. Impression:               - Perianal skin tags found on perianal exam.                           - One 3 mm polyp in the sigmoid colon, removed with                            a cold snare. Resected and retrieved.                           - Three 1 to 3 mm polyps in the rectum, removed                            with a cold snare. Resected and retrieved.                           - There was significant looping of the colon.                           - The distal rectum and anal verge are normal on                            retroflexion view. Recommendation:           - Patient has a contact number available for                            emergencies. The signs and symptoms of potential                            delayed complications were discussed with the                            patient. Return to normal activities tomorrow.                            Written discharge instructions were provided to the                            patient.                           - Resume previous diet.                           - Continue present medications.                           -  Await pathology results.                           - Repeat colonoscopy in 5 years for surveillance                            due to family history, or potentially sooner based                            on pathology  results.                           - Return to GI office PRN.                           - Resume Eliquis  (apixaban ) at prior dose tomorrow. Harry Lindau, MD 09/08/2023 3:55:48 PM

## 2023-09-12 ENCOUNTER — Ambulatory Visit: Admitting: Family

## 2023-09-12 ENCOUNTER — Telehealth: Payer: Self-pay

## 2023-09-12 NOTE — Telephone Encounter (Signed)
 Left message on follow up call.

## 2023-09-14 LAB — SURGICAL PATHOLOGY

## 2023-09-21 ENCOUNTER — Ambulatory Visit: Admitting: Family

## 2023-09-21 ENCOUNTER — Other Ambulatory Visit (HOSPITAL_BASED_OUTPATIENT_CLINIC_OR_DEPARTMENT_OTHER): Payer: Self-pay

## 2023-09-21 ENCOUNTER — Other Ambulatory Visit (HOSPITAL_COMMUNITY): Payer: Self-pay

## 2023-09-21 ENCOUNTER — Telehealth: Payer: Self-pay

## 2023-09-21 ENCOUNTER — Encounter: Payer: Self-pay | Admitting: Family

## 2023-09-21 VITALS — BP 132/84 | HR 91 | Ht 67.0 in | Wt 277.2 lb

## 2023-09-21 DIAGNOSIS — E119 Type 2 diabetes mellitus without complications: Secondary | ICD-10-CM

## 2023-09-21 DIAGNOSIS — Z7985 Long-term (current) use of injectable non-insulin antidiabetic drugs: Secondary | ICD-10-CM

## 2023-09-21 MED ORDER — OZEMPIC (0.25 OR 0.5 MG/DOSE) 2 MG/3ML ~~LOC~~ SOPN
0.2500 mg | PEN_INJECTOR | SUBCUTANEOUS | 0 refills | Status: DC
  Filled 2023-09-21: qty 3, 28d supply, fill #0

## 2023-09-21 NOTE — Telephone Encounter (Signed)
 Pharmacy Patient Advocate Encounter   Received notification from CoverMyMeds that prior authorization for Ozempic (0.25 or 0.5 MG/DOSE) 2MG /3ML pen-injectors is required/requested.   Insurance verification completed.   The patient is insured through [employer] .   Per test claim: PA required; PA started via CoverMyMeds. KEY [id number] . Waiting for clinical questions to populate.

## 2023-09-21 NOTE — Progress Notes (Signed)
 Jody Sandoval is a 43 y.o. female with the following history as recorded in EpicCare:  Patient Active Problem List   Diagnosis Date Noted   FH: colon cancer 08/20/2021   Hyperlipidemia 08/20/2021   Right carpal tunnel syndrome 06/30/2021   Panic disorder 03/06/2019   Depression 11/21/2016   Bilateral pulmonary embolism (HCC) 05/09/2016   Essential hypertension 01/18/2013   ANEMIA-IRON DEFICIENCY 12/18/2009    Current Outpatient Medications  Medication Sig Dispense Refill   atomoxetine  (STRATTERA ) 60 MG capsule Take 1 capsule (60 mg total) by mouth daily. 30 capsule 2   ELIQUIS  5 MG TABS tablet Take 1 tablet (5 mg total) by mouth 2 (two) times daily. 180 tablet 3   escitalopram  (LEXAPRO ) 20 MG tablet Take one tablet daily 90 tablet 0   hydrochlorothiazide  (HYDRODIURIL ) 12.5 MG tablet Take 1 tablet (12.5 mg total) by mouth daily. 90 tablet 3   potassium chloride  (MICRO-K ) 10 MEQ CR capsule Take 1 capsule (10 mEq total) by mouth daily. 90 capsule 3   propranolol  (INDERAL ) 10 MG tablet Take 1 tablet (10 mg total) by mouth 2 (two) times daily. 180 tablet 1   rosuvastatin  (CRESTOR ) 5 MG tablet Take 1 tablet (5 mg total) by mouth daily. 90 tablet 3   Semaglutide,0.25 or 0.5MG /DOS, (OZEMPIC, 0.25 OR 0.5 MG/DOSE,) 2 MG/3ML SOPN Inject 0.25 mg into the skin once a week. 3 mL 0   valsartan  (DIOVAN ) 160 MG tablet Take 1 tablet (160 mg total) by mouth daily. 90 tablet 3   No current facility-administered medications for this visit.    Allergies: Nifedipine, Ferric carboxymaltose, Lisinopril, and Tape  Past Medical History:  Diagnosis Date   Anemia    Anxiety    Depression    DVT (deep venous thrombosis) (HCC) 12/17/2008   Hypertension    Pulmonary embolism [facility])     Past Surgical History:  Procedure Laterality Date   ABDOMINAL HYSTERECTOMY  09/03/2020   CESAREAN SECTION  09/15/2002   HERNIA REPAIR     same time as hysterectomy   TONSILLECTOMY      Family History  Problem Relation  Age of Onset   Hypertension Mother    Diabetes Mother    Hypertension Father    Heart attack Father    Colon cancer Sister    Stomach cancer Neg Hx    Esophageal cancer Neg Hx     Social History   Tobacco Use   Smoking status: Never   Smokeless tobacco: Not on file  Substance Use Topics   Alcohol use: No    Subjective:   Follow up to discuss starting GLP-1; Hgba1c elevated c/w diabetes x 1 year; has been diet controlled to this point; would like to start medication to help "jump start" weight loss goals- prefers not to stay on medication long term; Denies any chest pain, shortness of breath, blurred vision or headache   Objective:  Vitals:   09/21/23 1342  BP: 132/84  Pulse: 91  SpO2: 99%  Weight: 277 lb 3.2 oz (125.7 kg)  Height: 5\' 7"  (1.702 m)    General: Well developed, well nourished, in no acute distress  Skin : Warm and dry.  Head: Normocephalic and atraumatic  Eyes: Sclera and conjunctiva clear; pupils round and reactive to light; extraocular movements intact  Ears: External normal; canals clear; tympanic membranes normal  Oropharynx: Pink, supple. No suspicious lesions  Neck: Supple without thyromegaly, adenopathy  Lungs: Respirations unlabored; clear to auscultation bilaterally without wheeze, rales, rhonchi  CVS exam: normal rate and regular rhythm.  Neurologic: Alert and oriented; speech intact; face symmetrical; moves all extremities well; CNII-XII intact without focal deficit   Assessment:  1. Diet-controlled diabetes mellitus (HCC)     Plan:  Update labs today; Rx for Ozempic 0.25 mg weekly x 4 weeks; risks/ benefits of medication discussed; she will stop immediately if she develops abdominal pain and call with concerning constipation or diarrhea; follow up by phone in 3 weeks with response.   No follow-ups on file.  Orders Placed This Encounter  Procedures   CBC with Differential/Platelet   Comp Met (CMET)   Hemoglobin A1c   Urine Microalbumin  w/creat. ratio    Requested Prescriptions   Signed Prescriptions Disp Refills   Semaglutide,0.25 or 0.5MG /DOS, (OZEMPIC, 0.25 OR 0.5 MG/DOSE,) 2 MG/3ML SOPN 3 mL 0    Sig: Inject 0.25 mg into the skin once a week.

## 2023-09-22 ENCOUNTER — Other Ambulatory Visit (HOSPITAL_BASED_OUTPATIENT_CLINIC_OR_DEPARTMENT_OTHER): Payer: Self-pay

## 2023-09-22 ENCOUNTER — Ambulatory Visit: Payer: Self-pay | Admitting: Family

## 2023-09-22 ENCOUNTER — Ambulatory Visit: Payer: Self-pay | Admitting: Gastroenterology

## 2023-09-22 LAB — COMPREHENSIVE METABOLIC PANEL WITH GFR
ALT: 20 U/L (ref 0–35)
AST: 17 U/L (ref 0–37)
Albumin: 4.3 g/dL (ref 3.5–5.2)
Alkaline Phosphatase: 64 U/L (ref 39–117)
BUN: 12 mg/dL (ref 6–23)
CO2: 32 meq/L (ref 19–32)
Calcium: 9.5 mg/dL (ref 8.4–10.5)
Chloride: 101 meq/L (ref 96–112)
Creatinine, Ser: 0.87 mg/dL (ref 0.40–1.20)
GFR: 80.47 mL/min (ref >60.00)
Glucose, Bld: 95 mg/dL (ref 70–99)
Potassium: 3.5 meq/L (ref 3.5–5.1)
Sodium: 139 meq/L (ref 135–145)
Total Bilirubin: 0.4 mg/dL (ref 0.2–1.2)
Total Protein: 7 g/dL (ref 6.0–8.3)

## 2023-09-22 LAB — MICROALBUMIN / CREATININE URINE RATIO
Creatinine,U: 166.1 mg/dL
Microalb Creat Ratio: 7.9 mg/g (ref 0.0–30.0)
Microalb, Ur: 1.3 mg/dL (ref 0.0–1.9)

## 2023-09-22 LAB — CBC WITH DIFFERENTIAL/PLATELET
Basophils Absolute: 0.1 10*3/uL (ref 0.0–0.1)
Basophils Relative: 1.1 % (ref 0.0–3.0)
Eosinophils Absolute: 0.2 10*3/uL (ref 0.0–0.7)
Eosinophils Relative: 3.1 % (ref 0.0–5.0)
HCT: 41.8 % (ref 36.0–46.0)
Hemoglobin: 13.7 g/dL (ref 12.0–15.0)
Lymphocytes Relative: 27.9 % (ref 12.0–46.0)
Lymphs Abs: 1.6 10*3/uL (ref 0.7–4.0)
MCHC: 32.8 g/dL (ref 30.0–36.0)
MCV: 91.6 fl (ref 78.0–100.0)
Monocytes Absolute: 0.4 10*3/uL (ref 0.1–1.0)
Monocytes Relative: 7.3 % (ref 3.0–12.0)
Neutro Abs: 3.4 10*3/uL (ref 1.4–7.7)
Neutrophils Relative %: 60.6 % (ref 43.0–77.0)
Platelets: 230 10*3/uL (ref 150.0–400.0)
RBC: 4.57 Mil/uL (ref 3.87–5.11)
RDW: 13.6 % (ref 11.5–15.5)
WBC: 5.7 10*3/uL (ref 4.0–10.5)

## 2023-09-22 LAB — HEMOGLOBIN A1C: Hgb A1c MFr Bld: 6.9 % — ABNORMAL HIGH (ref 4.6–6.5)

## 2023-09-22 NOTE — Telephone Encounter (Signed)
 PLEASE BE ADVISED Clinical questions have been answered and PA submitted.TO PLAN. PA currently Pending.

## 2023-09-25 ENCOUNTER — Other Ambulatory Visit (HOSPITAL_BASED_OUTPATIENT_CLINIC_OR_DEPARTMENT_OTHER): Payer: Self-pay

## 2023-09-26 ENCOUNTER — Other Ambulatory Visit (HOSPITAL_BASED_OUTPATIENT_CLINIC_OR_DEPARTMENT_OTHER): Payer: Self-pay

## 2023-09-27 ENCOUNTER — Other Ambulatory Visit (HOSPITAL_BASED_OUTPATIENT_CLINIC_OR_DEPARTMENT_OTHER): Payer: Self-pay

## 2023-09-27 NOTE — Telephone Encounter (Signed)
 PLEASE BE ADVISED HAVE SUBMITTED QUESTION TO PLAN AND STILL WAITING ON DETERMINATION FROM PLAN [employer]

## 2023-09-28 ENCOUNTER — Other Ambulatory Visit (HOSPITAL_BASED_OUTPATIENT_CLINIC_OR_DEPARTMENT_OTHER): Payer: Self-pay

## 2023-09-28 NOTE — Telephone Encounter (Signed)
 Pharmacy Patient Advocate Encounter  Received notification from [employer] that Prior Authorization for Ozempic  (0.25 or 0.5 MG/DOSE) 2MG /3ML pen-injectors has been APPROVED from 09/22/2023 to 09/26/2024   PA #/Case ID/Reference #: [id number]

## 2023-10-20 ENCOUNTER — Other Ambulatory Visit (HOSPITAL_BASED_OUTPATIENT_CLINIC_OR_DEPARTMENT_OTHER): Payer: Self-pay

## 2023-10-25 ENCOUNTER — Other Ambulatory Visit (HOSPITAL_BASED_OUTPATIENT_CLINIC_OR_DEPARTMENT_OTHER): Payer: Self-pay

## 2023-10-26 ENCOUNTER — Other Ambulatory Visit (HOSPITAL_BASED_OUTPATIENT_CLINIC_OR_DEPARTMENT_OTHER): Payer: Self-pay

## 2023-10-28 ENCOUNTER — Other Ambulatory Visit (HOSPITAL_BASED_OUTPATIENT_CLINIC_OR_DEPARTMENT_OTHER): Payer: Self-pay

## 2023-10-28 ENCOUNTER — Other Ambulatory Visit: Payer: Self-pay | Admitting: Family

## 2023-10-30 ENCOUNTER — Other Ambulatory Visit: Payer: Self-pay

## 2023-10-30 ENCOUNTER — Other Ambulatory Visit (HOSPITAL_BASED_OUTPATIENT_CLINIC_OR_DEPARTMENT_OTHER): Payer: Self-pay

## 2023-10-30 MED ORDER — PROPRANOLOL HCL 10 MG PO TABS
10.0000 mg | ORAL_TABLET | Freq: Two times a day (BID) | ORAL | 1 refills | Status: AC
  Filled 2023-10-30: qty 180, 90d supply, fill #0

## 2023-11-02 ENCOUNTER — Encounter (INDEPENDENT_AMBULATORY_CARE_PROVIDER_SITE_OTHER): Admitting: Family Medicine

## 2023-11-02 ENCOUNTER — Other Ambulatory Visit (HOSPITAL_BASED_OUTPATIENT_CLINIC_OR_DEPARTMENT_OTHER): Payer: Self-pay

## 2023-11-03 ENCOUNTER — Other Ambulatory Visit (HOSPITAL_BASED_OUTPATIENT_CLINIC_OR_DEPARTMENT_OTHER): Payer: Self-pay

## 2023-11-03 MED ORDER — ATOMOXETINE HCL 60 MG PO CAPS
60.0000 mg | ORAL_CAPSULE | Freq: Every day | ORAL | 2 refills | Status: DC
  Filled 2023-11-03: qty 30, 30d supply, fill #0
  Filled 2023-12-19: qty 30, 30d supply, fill #1

## 2023-11-03 MED ORDER — ESCITALOPRAM OXALATE 20 MG PO TABS
20.0000 mg | ORAL_TABLET | Freq: Every day | ORAL | 0 refills | Status: DC
  Filled 2023-11-03: qty 90, 90d supply, fill #0

## 2023-11-04 ENCOUNTER — Encounter: Payer: Self-pay | Admitting: Family

## 2023-11-13 ENCOUNTER — Other Ambulatory Visit (HOSPITAL_BASED_OUTPATIENT_CLINIC_OR_DEPARTMENT_OTHER): Payer: Self-pay

## 2023-11-13 ENCOUNTER — Other Ambulatory Visit: Payer: Self-pay | Admitting: Family

## 2023-11-13 MED ORDER — OZEMPIC (0.25 OR 0.5 MG/DOSE) 2 MG/3ML ~~LOC~~ SOPN
0.5000 mg | PEN_INJECTOR | SUBCUTANEOUS | 1 refills | Status: DC
  Filled 2023-11-13: qty 3, 28d supply, fill #0
  Filled 2023-12-19: qty 3, 28d supply, fill #1

## 2023-11-20 ENCOUNTER — Other Ambulatory Visit (HOSPITAL_BASED_OUTPATIENT_CLINIC_OR_DEPARTMENT_OTHER): Payer: Self-pay

## 2023-12-19 ENCOUNTER — Other Ambulatory Visit (HOSPITAL_BASED_OUTPATIENT_CLINIC_OR_DEPARTMENT_OTHER): Payer: Self-pay

## 2023-12-22 ENCOUNTER — Other Ambulatory Visit (HOSPITAL_BASED_OUTPATIENT_CLINIC_OR_DEPARTMENT_OTHER): Payer: Self-pay

## 2023-12-22 MED ORDER — ATOMOXETINE HCL 60 MG PO CAPS
60.0000 mg | ORAL_CAPSULE | Freq: Every day | ORAL | 2 refills | Status: DC
  Filled 2023-12-22: qty 30, 30d supply, fill #0

## 2023-12-22 MED ORDER — ESCITALOPRAM OXALATE 20 MG PO TABS
20.0000 mg | ORAL_TABLET | Freq: Every day | ORAL | 0 refills | Status: AC
  Filled 2023-12-22 – 2024-02-22 (×2): qty 90, 90d supply, fill #0

## 2024-01-08 ENCOUNTER — Other Ambulatory Visit: Payer: Self-pay | Admitting: Family

## 2024-01-08 NOTE — Telephone Encounter (Unsigned)
 Copied from CRM [id number]. Topic: Clinical - Medication Refill >> Jan 08, 2024  3:25 PM Alexandria E wrote: Medication: Semaglutide ,0.25 or 0.5MG /DOS, (OZEMPIC , 0.25 OR 0.5 MG/DOSE,) 2 MG/3ML SOPN  Has the patient contacted their pharmacy? No (Agent: If no, request that the patient contact the pharmacy for the refill. If patient does not wish to contact the pharmacy document the reason why and proceed with request.) (Agent: If yes, when and what did the pharmacy advise?)  This is the patient's preferred pharmacy:  [facility] [place] [facility] [address] [place] [address] Phone: [phone] Fax: [phone]  Is this the correct pharmacy for this prescription? Yes If no, delete pharmacy and type the correct one.   Has the prescription been filled recently? No  Is the patient out of the medication? No, one week left.  Has the patient been seen for an appointment in the last year OR does the patient have an upcoming appointment? Yes  Can we respond through MyChart? Yes  Agent: Please be advised that Rx refills may take up to 3 business days. We ask that you follow-up with your pharmacy.

## 2024-01-09 MED ORDER — OZEMPIC (0.25 OR 0.5 MG/DOSE) 2 MG/3ML ~~LOC~~ SOPN
0.5000 mg | PEN_INJECTOR | SUBCUTANEOUS | 1 refills | Status: DC
  Filled 2024-01-09: qty 3, 28d supply, fill #0
  Filled 2024-02-15: qty 3, 28d supply, fill #1

## 2024-01-10 ENCOUNTER — Other Ambulatory Visit (HOSPITAL_BASED_OUTPATIENT_CLINIC_OR_DEPARTMENT_OTHER): Payer: Self-pay

## 2024-02-13 ENCOUNTER — Ambulatory Visit: Payer: Self-pay

## 2024-02-13 NOTE — Telephone Encounter (Signed)
 FYI Only or Action Required?: FYI only for provider.  Patient was last seen in primary care on 09/21/2023 by Jason Leita Repine, FNP.  Called Nurse Triage reporting Chest Pain.  Symptoms began a week ago.  Symptoms are: unchanged.  Triage Disposition: See PCP When Office is Open (Within 3 Days)  Patient/caregiver understands and will follow disposition?: Yes     Copied from CRM [id number]. Topic: Clinical - Red Word Triage >> Feb 13, 2024  4:48 PM Wess RAMAN wrote: Red Word that prompted transfer to Nurse Triage: Pain in collar bone. Strong 6. Has been going on for a week now.       Reason for Disposition  [1] MODERATE pain (e.g., interferes with normal activities) AND [2] present > 3 days  Answer Assessment - Initial Assessment Questions 1. LOCATION: Where does it hurt?       Right sided collar bone  2. RADIATION: Does the pain go anywhere else? (e.g., into neck, jaw, arms, back)     No 3. ONSET: When did the chest pain begin? (Minutes, hours or days)      1 week ago 4. PATTERN: Does the pain come and go, or has it been constant since it started?  Does it get worse with exertion?      Constant  5. DURATION: How long does it last (e.g., seconds, minutes, hours)     N/A 6. SEVERITY: How bad is the pain?  (e.g., Scale 1-10; mild, moderate, or severe)     6/10 7. CARDIAC RISK FACTORS: Do you have any history of heart problems or risk factors for heart disease? (e.g., angina, prior heart attack; diabetes, high blood pressure, high cholesterol, smoker, or strong family history of heart disease)     Hypertension  8. PULMONARY RISK FACTORS: Do you have any history of lung disease?  (e.g., blood clots in lung, asthma, emphysema, birth control pills)     No 9. CAUSE: What do you think is causing the chest pain?     Believes due to lifting her daughter  11. OTHER SYMPTOMS: Do you have any other symptoms? (e.g., dizziness, nausea, vomiting, sweating, fever,  difficulty breathing, cough)       No  Answer Assessment - Initial Assessment Questions 1. ONSET: When did the muscle aches or body pains start?      1 week ago 2. LOCATION: What part of your body is hurting? (e.g., entire body, arms, legs)      Right sided collar bone 3. SEVERITY: How bad is the pain? (Scale 1-10; or mild, moderate, severe)     6/10 4. CAUSE: What do you think is causing the pains?     Believes due to lifting daughter  5. FEVER: Do you have a fever? If Yes, ask: What is your temperature, how was it measured, and  when did it start?      No 6. OTHER SYMPTOMS: Do you have any other symptoms? (e.g., chest pain, cold or flu symptoms, rash, weakness, weight loss)     No  Protocols used: Chest Pain-A-AH, Muscle Aches and Body Pain-A-AH

## 2024-02-14 ENCOUNTER — Encounter: Admitting: Student

## 2024-02-14 NOTE — Progress Notes (Unsigned)
 Acute Office Visit  Subjective:     Patient ID: Jody Sandoval, female    DOB: January 10, 1981, 43 y.o.   MRN: [id number]  No chief complaint on file.   HPI Patient is in today for acute visit    History of Present Illness Jody Sandoval is a 43 year old female who presents with collarbone pain.  She experiences sharp, localized pain in the right collarbone area for the past week and a half, rated 6 out of 10 in severity. The pain is tender to touch and worsens with movement. There is no history of trauma or falls. She also experiences itching below the collarbone, distinct from the pain.  Does have young children, often lifts child on the right side . She is on Eliquis  for Hx DVT.  Patient denies fever, chills, SOB, CP, palpitations, dyspnea, edema, HA, vision changes, N/V/D, numbness, tingling, abdominal pain, urinary symptoms, rash, weight changes, and recent illness or hospitalizations.    Wt Readings from Last 3 Encounters:  02/15/24 267 lb (121.1 kg)  09/21/23 277 lb 3.2 oz (125.7 kg)  09/08/23 272 lb (123.4 kg)     ROS  See HPI    Objective:    BP 119/81   Pulse 79   Ht 5' 7 (1.702 m)   Wt 267 lb (121.1 kg)   LMP 01/24/2011   SpO2 98%   BMI 41.82 kg/m    Physical Exam  General: No acute distress. Awake and conversant. +obese Eyes: Normal conjunctiva, anicteric. Round symmetric pupils.  ENT: Hearing grossly intact. No nasal discharge.  Respiratory: CTAB. Respirations are non-labored. No wheezing.  Skin: Warm. No rashes or ulcers.  Psych: Alert and oriented. Cooperative, Appropriate mood and affect, Normal judgment.  CV: RRR. No murmur. No lower extremity edema.  MSK: Normal ambulation. No clubbing or cyanosis.  +right side collarbone: TWP, mild. No deformity, skin WNL. Pain with movement right arm with extension. FULL ROM, slight limited d/t collarbone pain Neuro:  CN II-XII grossly normal.    No results found for any visits on 02/15/24.       Assessment & Plan:   Problem List Items Addressed This Visit     Acute costochondritis   Pain likely musculoskeletal, no trauma history, differential includes muscular pain or costochondritis. No fracture evidence, imaging if symptoms persist or worsen. - Advise rest, avoid lifting on affected side. - Apply heat. - Prescribe Mobic with caution due to Eliquis ; monitor for bleeding. - Consider imaging if pain persists beyond four weeks or worsens. - Use Tylenol  instead if pain is not severe. -Avoid lifting, Warm compresses or gentle heat therapy. RTC if pain severe or worsens, or numbness, tingling, or weakness      Class 3 severe obesity without serious comorbidity with body mass index (BMI) of 40.0 to 44.9 in adult [facility])   Encouraged DASH or MIND diet, decrease po intake and increase exercise as tolerated. Needs 7-8 hours of sleep nightly. Avoid trans fats, eat small, frequent meals every 4-5 hours with lean proteins, complex carbs and healthy fats. Minimize simple carbs, high fat foods and processed foods        Collar bone pain - Primary   Relevant Medications   meloxicam (MOBIC) 7.5 MG tablet   Type 2 diabetes mellitus without complication, without long-term current use of insulin (HCC)   On Ozempic  0.5 mg inj weekly. Denies hypoglycemic events, reports no adverse Ses. 10-pound weight loss since June. A1c re-evaluation today. Emphasized diet  and exercise. - Check A1c level. - Consider increasing Ozempic  dose depending on A1C - Encourage diet and exercise for weight management.      Relevant Orders   HgB A1c       Meds ordered this encounter  Medications   meloxicam (MOBIC) 7.5 MG tablet    Sig: Take 1 tablet (7.5 mg total) by mouth daily as needed for pain.    Dispense:  15 tablet    Refill:  0    Supervising Provider:   DOMENICA BLACKBIRD A [[id number]]    No follow-ups on file.  Jody Overbey L Raigen Jagielski, NP

## 2024-02-15 ENCOUNTER — Other Ambulatory Visit: Payer: Self-pay

## 2024-02-15 ENCOUNTER — Encounter: Payer: Self-pay | Admitting: Student

## 2024-02-15 ENCOUNTER — Ambulatory Visit: Payer: Self-pay | Admitting: Student

## 2024-02-15 ENCOUNTER — Other Ambulatory Visit (HOSPITAL_BASED_OUTPATIENT_CLINIC_OR_DEPARTMENT_OTHER): Payer: Self-pay

## 2024-02-15 ENCOUNTER — Ambulatory Visit (INDEPENDENT_AMBULATORY_CARE_PROVIDER_SITE_OTHER): Admitting: Student

## 2024-02-15 VITALS — BP 119/81 | HR 79 | Ht 67.0 in | Wt 267.0 lb

## 2024-02-15 DIAGNOSIS — E119 Type 2 diabetes mellitus without complications: Secondary | ICD-10-CM

## 2024-02-15 DIAGNOSIS — M898X1 Other specified disorders of bone, shoulder: Secondary | ICD-10-CM

## 2024-02-15 DIAGNOSIS — E66813 Obesity, class 3: Secondary | ICD-10-CM

## 2024-02-15 DIAGNOSIS — M940 Chondrocostal junction syndrome [Tietze]: Secondary | ICD-10-CM

## 2024-02-15 DIAGNOSIS — Z7985 Long-term (current) use of injectable non-insulin antidiabetic drugs: Secondary | ICD-10-CM

## 2024-02-15 DIAGNOSIS — Z6841 Body Mass Index (BMI) 40.0 and over, adult: Secondary | ICD-10-CM

## 2024-02-15 LAB — HEMOGLOBIN A1C: Hgb A1c MFr Bld: 6 % (ref 4.6–6.5)

## 2024-02-15 MED ORDER — MELOXICAM 7.5 MG PO TABS
7.5000 mg | ORAL_TABLET | Freq: Every day | ORAL | 0 refills | Status: AC | PRN
  Filled 2024-02-15: qty 15, 15d supply, fill #0

## 2024-02-15 MED ORDER — SEMAGLUTIDE (1 MG/DOSE) 4 MG/3ML ~~LOC~~ SOPN
1.0000 mg | PEN_INJECTOR | SUBCUTANEOUS | 3 refills | Status: AC
  Filled 2024-02-15: qty 3, 28d supply, fill #0
  Filled 2024-03-18: qty 3, 28d supply, fill #1
  Filled 2024-05-01: qty 3, 28d supply, fill #2

## 2024-02-15 NOTE — Assessment & Plan Note (Signed)
 On Ozempic  0.5 mg inj weekly. Denies hypoglycemic events, reports no adverse Ses. 10-pound weight loss since June. A1c re-evaluation today. Emphasized diet and exercise. - Check A1c level. - Consider increasing Ozempic  dose depending on A1C - Encourage diet and exercise for weight management.

## 2024-02-15 NOTE — Assessment & Plan Note (Signed)
 Encouraged DASH or MIND diet, decrease po intake and increase exercise as tolerated. Needs 7-8 hours of sleep nightly. Avoid trans fats, eat small, frequent meals every 4-5 hours with lean proteins, complex carbs and healthy fats. Minimize simple carbs, high fat foods and processed foods

## 2024-02-15 NOTE — Assessment & Plan Note (Addendum)
 Pain likely musculoskeletal, no trauma history, differential includes muscular pain or costochondritis. No fracture evidence, imaging if symptoms persist or worsen. - Advise rest, avoid lifting on affected side. - Apply heat. - Prescribe Mobic with caution due to Eliquis ; monitor for bleeding. - Consider imaging if pain persists beyond four weeks or worsens. - Use Tylenol  instead if pain is not severe. -Avoid lifting, Warm compresses or gentle heat therapy. RTC if pain severe or worsens, or numbness, tingling, or weakness

## 2024-02-16 ENCOUNTER — Other Ambulatory Visit (HOSPITAL_BASED_OUTPATIENT_CLINIC_OR_DEPARTMENT_OTHER): Payer: Self-pay

## 2024-02-22 ENCOUNTER — Other Ambulatory Visit (HOSPITAL_BASED_OUTPATIENT_CLINIC_OR_DEPARTMENT_OTHER): Payer: Self-pay

## 2024-03-21 ENCOUNTER — Other Ambulatory Visit (HOSPITAL_COMMUNITY): Payer: Self-pay

## 2024-03-22 ENCOUNTER — Encounter: Admitting: Student

## 2024-05-01 ENCOUNTER — Other Ambulatory Visit: Payer: Self-pay

## 2024-05-02 ENCOUNTER — Other Ambulatory Visit: Payer: Self-pay

## 2024-05-09 ENCOUNTER — Other Ambulatory Visit (HOSPITAL_BASED_OUTPATIENT_CLINIC_OR_DEPARTMENT_OTHER): Payer: Self-pay

## 2024-05-09 ENCOUNTER — Other Ambulatory Visit: Payer: Self-pay

## 2024-05-09 MED ORDER — ELIQUIS 5 MG PO TABS
5.0000 mg | ORAL_TABLET | Freq: Two times a day (BID) | ORAL | 3 refills | Status: AC
  Filled 2024-05-09: qty 180, 90d supply, fill #0

## 2024-05-09 NOTE — Telephone Encounter (Signed)
 Copied from CRM #[id number]. Topic: Clinical - Medication Refill >> May 09, 2024  2:56 PM Alfonso HERO wrote: Medication: ELIQUIS  5 MG TABS tablet  Has the patient contacted their pharmacy? Yes (Agent: If no, request that the patient contact the pharmacy for the refill. If patient does not wish to contact the pharmacy document the reason why and proceed with request.) (Agent: If yes, when and what did the pharmacy advise?)  This is the patient's preferred pharmacy:  [facility] [place] [facility] [address] [place] [address] Phone: [phone] Fax: [phone]  Is this the correct pharmacy for this prescription? Yes If no, delete pharmacy and type the correct one.   Has the prescription been filled recently? Yes  Is the patient out of the medication? Yes  Has the patient been seen for an appointment in the last year OR does the patient have an upcoming appointment? Yes  Can we respond through MyChart? Yes  Agent: Please be advised that Rx refills may take up to 3 business days. We ask that you follow-up with your pharmacy.

## 2024-05-10 ENCOUNTER — Other Ambulatory Visit (HOSPITAL_BASED_OUTPATIENT_CLINIC_OR_DEPARTMENT_OTHER): Payer: Self-pay

## 2024-05-10 ENCOUNTER — Other Ambulatory Visit: Payer: Self-pay

## 2024-05-22 ENCOUNTER — Encounter: Admitting: Family Medicine

## 2024-05-22 DIAGNOSIS — F32A Depression, unspecified: Secondary | ICD-10-CM

## 2024-05-22 DIAGNOSIS — I10 Essential (primary) hypertension: Secondary | ICD-10-CM

## 2024-05-22 DIAGNOSIS — Z0000 Encounter for general adult medical examination without abnormal findings: Secondary | ICD-10-CM

## 2024-05-22 DIAGNOSIS — E119 Type 2 diabetes mellitus without complications: Secondary | ICD-10-CM

## 2024-06-25 ENCOUNTER — Encounter: Admitting: Physician Assistant
